# Patient Record
Sex: Female | Born: 1955 | Race: White | Hispanic: No | Marital: Married | Smoking: Never smoker
Health system: Southern US, Community
[De-identification: ages and names within clinical notes are randomized; demographics above are authoritative.]

## PROBLEM LIST (undated history)

## (undated) DIAGNOSIS — R87610 Atypical squamous cells of undetermined significance on cytologic smear of cervix (ASC-US): Secondary | ICD-10-CM

## (undated) DIAGNOSIS — Z87442 Personal history of urinary calculi: Secondary | ICD-10-CM

## (undated) DIAGNOSIS — J309 Allergic rhinitis, unspecified: Secondary | ICD-10-CM

## (undated) DIAGNOSIS — R011 Cardiac murmur, unspecified: Secondary | ICD-10-CM

## (undated) DIAGNOSIS — N200 Calculus of kidney: Secondary | ICD-10-CM

## (undated) DIAGNOSIS — Z8719 Personal history of other diseases of the digestive system: Secondary | ICD-10-CM

## (undated) DIAGNOSIS — E785 Hyperlipidemia, unspecified: Secondary | ICD-10-CM

## (undated) DIAGNOSIS — M5137 Other intervertebral disc degeneration, lumbosacral region: Secondary | ICD-10-CM

## (undated) DIAGNOSIS — Z8679 Personal history of other diseases of the circulatory system: Secondary | ICD-10-CM

## (undated) DIAGNOSIS — M949 Disorder of cartilage, unspecified: Secondary | ICD-10-CM

## (undated) DIAGNOSIS — M899 Disorder of bone, unspecified: Secondary | ICD-10-CM

## (undated) DIAGNOSIS — M19019 Primary osteoarthritis, unspecified shoulder: Secondary | ICD-10-CM

## (undated) DIAGNOSIS — Z87898 Personal history of other specified conditions: Secondary | ICD-10-CM

## (undated) HISTORY — DX: Personal history of urinary calculi: Z87.442

## (undated) HISTORY — DX: Disorder of cartilage, unspecified: M94.9

## (undated) HISTORY — DX: Calculus of kidney: N20.0

## (undated) HISTORY — DX: Personal history of other specified conditions: Z87.898

## (undated) HISTORY — DX: Disorder of bone, unspecified: M89.9

## (undated) HISTORY — DX: Hyperlipidemia, unspecified: E78.5

## (undated) HISTORY — PX: OTHER SURGICAL HISTORY: SHX169

## (undated) HISTORY — DX: Other intervertebral disc degeneration, lumbosacral region: M51.37

## (undated) HISTORY — DX: Primary osteoarthritis, unspecified shoulder: M19.019

## (undated) HISTORY — DX: Personal history of other diseases of the circulatory system: Z86.79

## (undated) HISTORY — DX: Atypical squamous cells of undetermined significance on cytologic smear of cervix (ASC-US): R87.610

## (undated) HISTORY — PX: WISDOM TOOTH EXTRACTION: SHX21

## (undated) HISTORY — DX: Personal history of other diseases of the digestive system: Z87.19

## (undated) HISTORY — DX: Allergic rhinitis, unspecified: J30.9

---

## 1999-08-20 ENCOUNTER — Other Ambulatory Visit: Admission: RE | Admit: 1999-08-20 | Discharge: 1999-08-20 | Payer: Self-pay | Admitting: Obstetrics and Gynecology

## 2001-03-13 ENCOUNTER — Other Ambulatory Visit: Admission: RE | Admit: 2001-03-13 | Discharge: 2001-03-13 | Payer: Self-pay | Admitting: Obstetrics and Gynecology

## 2002-07-13 ENCOUNTER — Ambulatory Visit (HOSPITAL_COMMUNITY): Admission: RE | Admit: 2002-07-13 | Discharge: 2002-07-13 | Payer: Self-pay | Admitting: Cardiovascular Disease

## 2002-07-13 ENCOUNTER — Encounter: Payer: Self-pay | Admitting: Cardiovascular Disease

## 2002-07-28 ENCOUNTER — Encounter: Payer: Self-pay | Admitting: Cardiovascular Disease

## 2002-07-28 ENCOUNTER — Ambulatory Visit (HOSPITAL_COMMUNITY): Admission: RE | Admit: 2002-07-28 | Discharge: 2002-07-28 | Payer: Self-pay | Admitting: Cardiovascular Disease

## 2002-08-18 ENCOUNTER — Encounter (HOSPITAL_BASED_OUTPATIENT_CLINIC_OR_DEPARTMENT_OTHER): Payer: Self-pay | Admitting: General Surgery

## 2002-08-20 ENCOUNTER — Encounter (INDEPENDENT_AMBULATORY_CARE_PROVIDER_SITE_OTHER): Payer: Self-pay | Admitting: *Deleted

## 2002-08-20 ENCOUNTER — Encounter (HOSPITAL_BASED_OUTPATIENT_CLINIC_OR_DEPARTMENT_OTHER): Payer: Self-pay | Admitting: General Surgery

## 2002-08-20 ENCOUNTER — Ambulatory Visit (HOSPITAL_COMMUNITY): Admission: RE | Admit: 2002-08-20 | Discharge: 2002-08-21 | Payer: Self-pay | Admitting: General Surgery

## 2003-03-04 ENCOUNTER — Other Ambulatory Visit: Admission: RE | Admit: 2003-03-04 | Discharge: 2003-03-04 | Payer: Self-pay | Admitting: Obstetrics and Gynecology

## 2004-05-30 ENCOUNTER — Other Ambulatory Visit: Admission: RE | Admit: 2004-05-30 | Discharge: 2004-05-30 | Payer: Self-pay | Admitting: Obstetrics and Gynecology

## 2004-09-20 ENCOUNTER — Ambulatory Visit: Payer: Self-pay | Admitting: Internal Medicine

## 2004-09-25 ENCOUNTER — Ambulatory Visit (HOSPITAL_COMMUNITY): Admission: RE | Admit: 2004-09-25 | Discharge: 2004-09-25 | Payer: Self-pay | Admitting: Internal Medicine

## 2004-09-27 ENCOUNTER — Ambulatory Visit: Payer: Self-pay | Admitting: Gastroenterology

## 2004-10-01 ENCOUNTER — Other Ambulatory Visit: Admission: RE | Admit: 2004-10-01 | Discharge: 2004-10-01 | Payer: Self-pay | Admitting: Gynecology

## 2004-10-08 ENCOUNTER — Ambulatory Visit: Payer: Self-pay | Admitting: Gastroenterology

## 2004-10-18 ENCOUNTER — Encounter (INDEPENDENT_AMBULATORY_CARE_PROVIDER_SITE_OTHER): Payer: Self-pay | Admitting: Specialist

## 2004-10-18 ENCOUNTER — Ambulatory Visit (HOSPITAL_BASED_OUTPATIENT_CLINIC_OR_DEPARTMENT_OTHER): Admission: RE | Admit: 2004-10-18 | Discharge: 2004-10-18 | Payer: Self-pay | Admitting: Gynecology

## 2004-10-18 ENCOUNTER — Ambulatory Visit (HOSPITAL_COMMUNITY): Admission: RE | Admit: 2004-10-18 | Discharge: 2004-10-18 | Payer: Self-pay | Admitting: Gynecology

## 2005-05-02 ENCOUNTER — Ambulatory Visit (HOSPITAL_COMMUNITY): Admission: RE | Admit: 2005-05-02 | Discharge: 2005-05-02 | Payer: Self-pay | Admitting: Internal Medicine

## 2005-05-02 ENCOUNTER — Ambulatory Visit: Payer: Self-pay | Admitting: Internal Medicine

## 2005-05-24 ENCOUNTER — Ambulatory Visit: Payer: Self-pay | Admitting: Internal Medicine

## 2005-06-12 ENCOUNTER — Ambulatory Visit: Payer: Self-pay | Admitting: Internal Medicine

## 2005-10-10 ENCOUNTER — Other Ambulatory Visit: Admission: RE | Admit: 2005-10-10 | Discharge: 2005-10-10 | Payer: Self-pay | Admitting: Gynecology

## 2006-03-26 IMAGING — US US TRANSVAGINAL NON-OB
1 series · 14 of 25 positions shown · non-contrast
Comparison: none

CLINICAL DATA: Heavy periods. 
 ULTRASOUND OF THE PELVIS:
 Transabdominal study reveals the uterus to measure 8.4 x 4.3 x 5.2 cm.  Echo pattern of the uterus is inhomogeneous.  Transvaginal study reveals the endometrial stripe to measure .74 cm.    There is suggestion of a 5 x 4 x 5 mm polyp in the endometrium.  
 Right ovary measures 3.2 x 2.5 x 2.1 cm with a 2.2 x 1.8 x 1.7 cm simple cyst of the right ovary.  The left ovary measures 2.7 x 1.2 x 1.7 cm with a 6 mm follicle of the left ovary.  No free fluid.

[Series 1: unknown · 0.32mm/px · 14 of 74 slices shown]
[im 1/74]
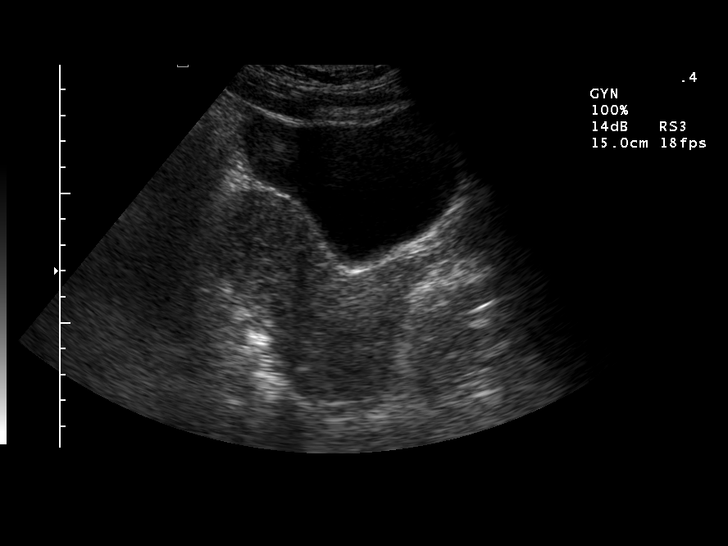
[im 7/74]
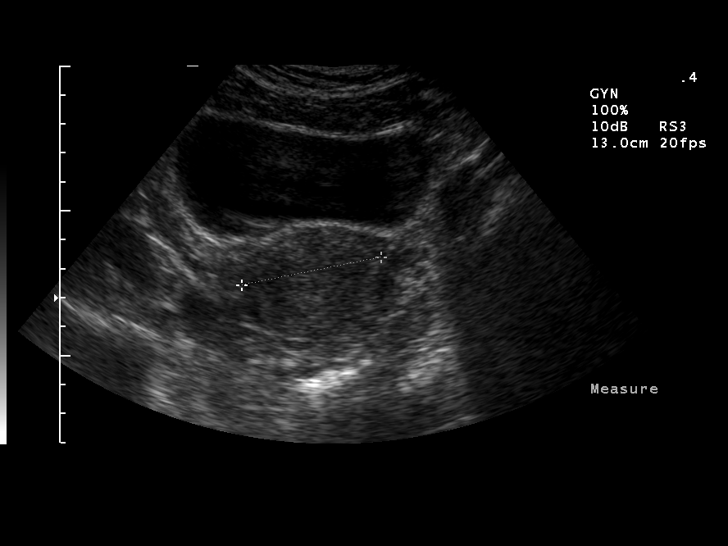
[im 13/74]
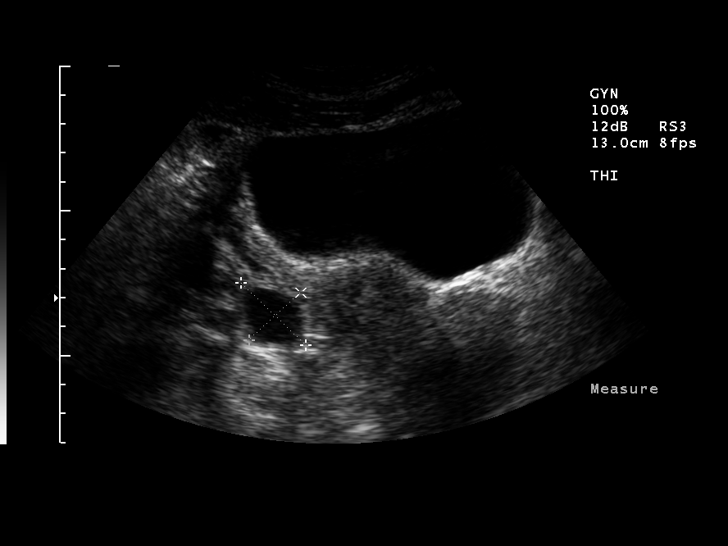
[im 19/74]
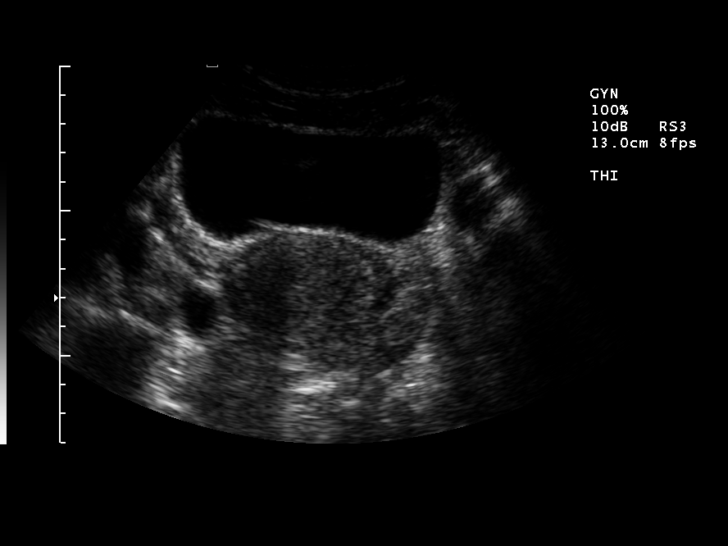
[im 25/74]
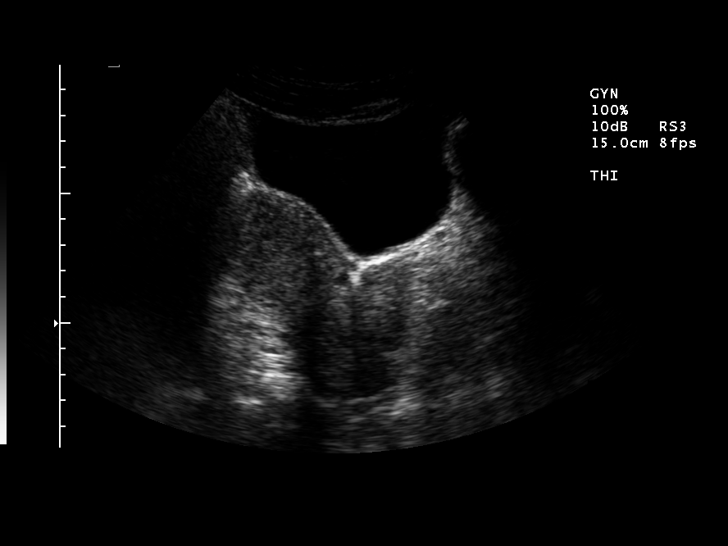
[im 28/74]
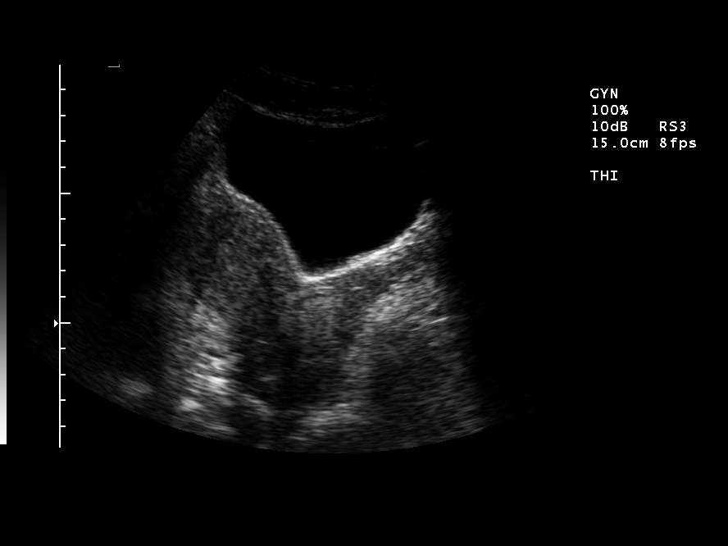
[im 34/74]
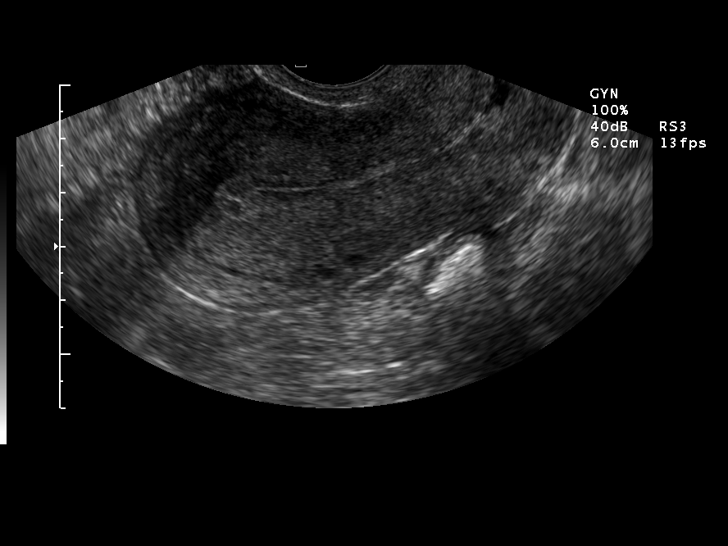
[im 40/74]
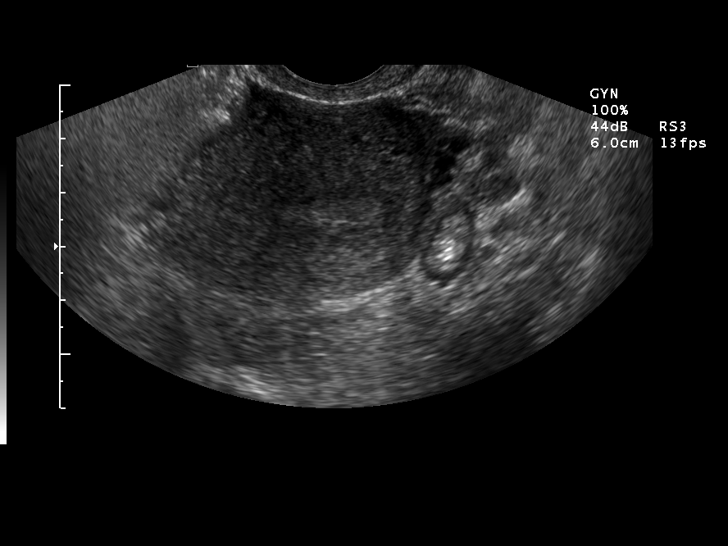
[im 46/74]
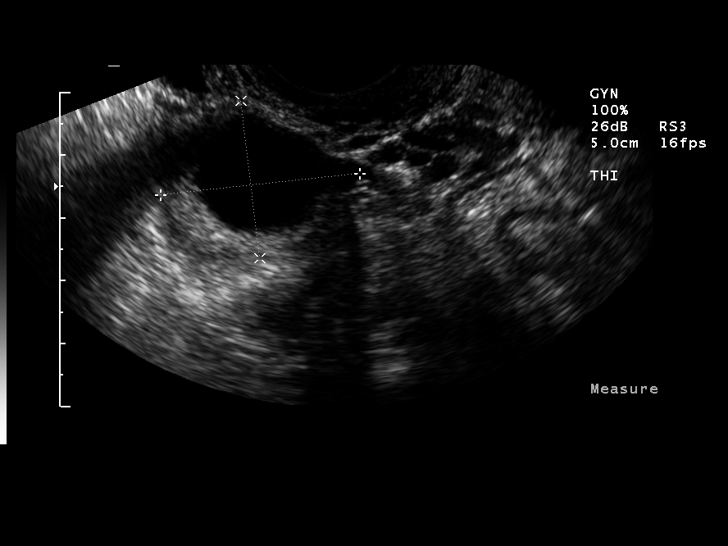
[im 49/74]
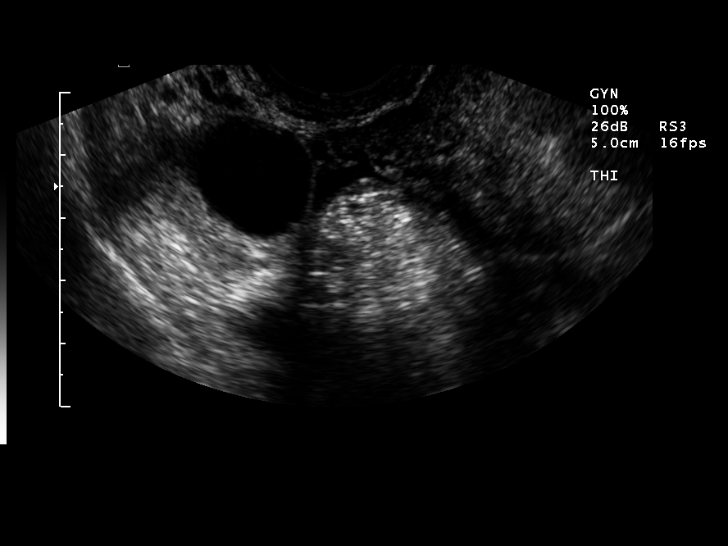
[im 55/74]
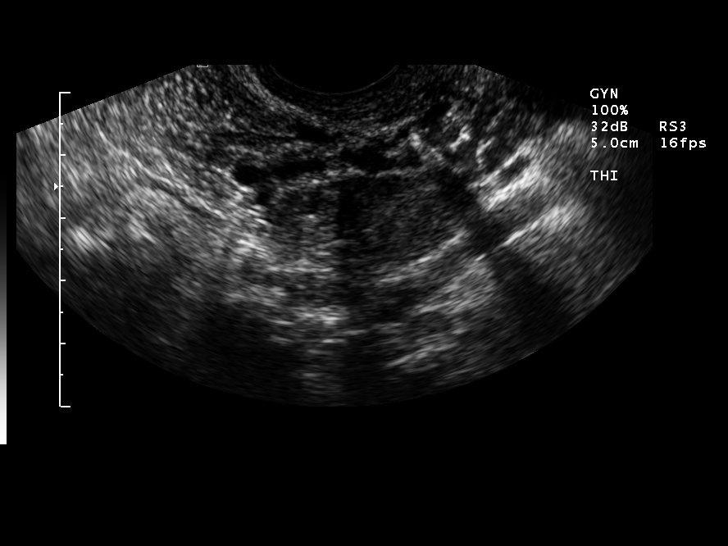
[im 61/74]
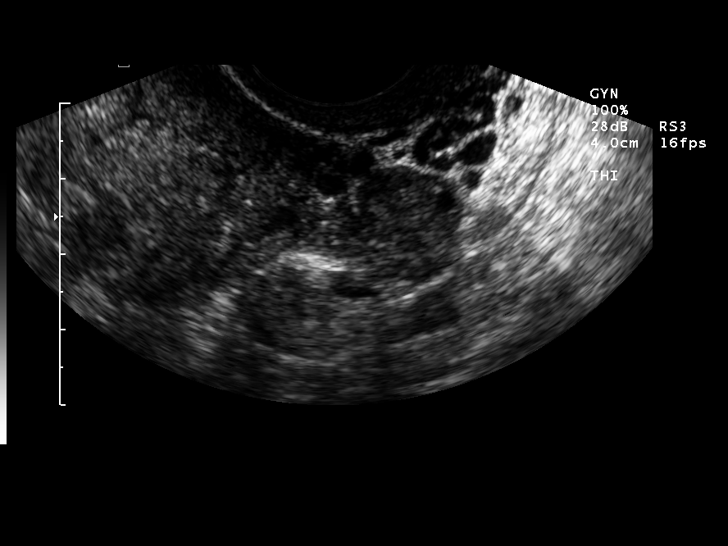
[im 67/74]
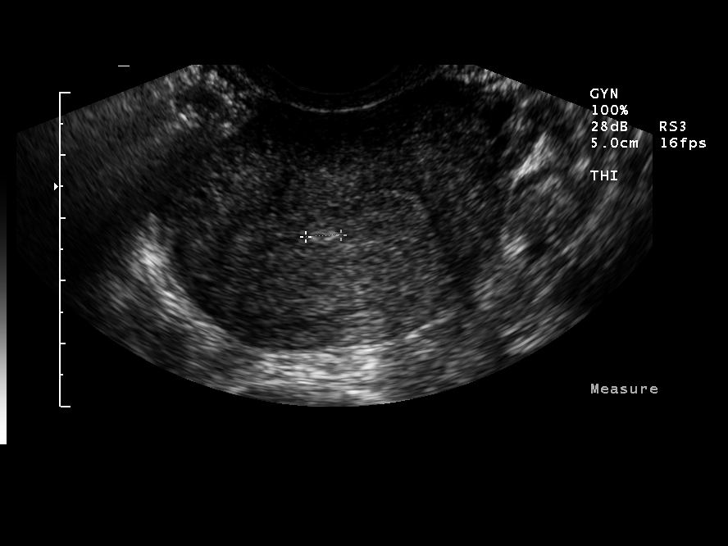
[im 74/74]
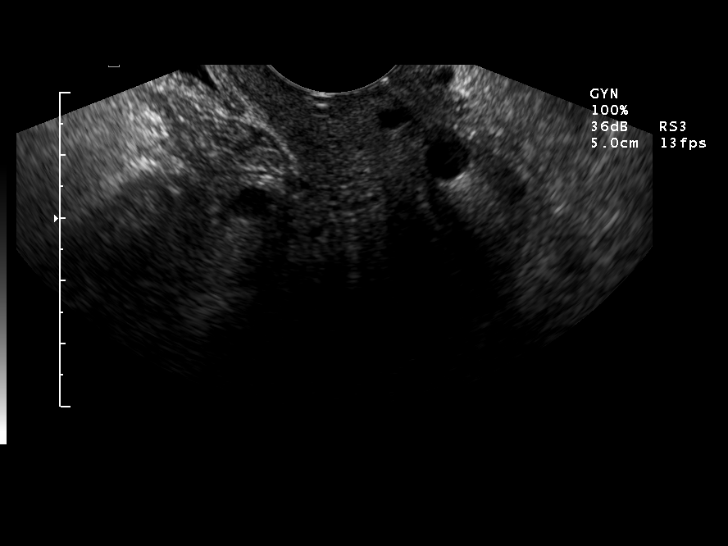

[14 of 25 positions shown; findings below may reference images not displayed]

IMPRESSION: 1.  There is a suspicious 5 mm polyp in the endometrium.  Sonohysterogram would be suggested for further evaluation. 
 2.  2.2 cm cyst of the right ovary.  Followup ultrasound would be suggested for further evaluation of this cyst which appears to represent a simple cyst.

## 2006-12-25 ENCOUNTER — Other Ambulatory Visit: Admission: RE | Admit: 2006-12-25 | Discharge: 2006-12-25 | Payer: Self-pay | Admitting: Gynecology

## 2007-01-06 ENCOUNTER — Ambulatory Visit: Payer: Self-pay | Admitting: Internal Medicine

## 2007-07-01 ENCOUNTER — Encounter: Payer: Self-pay | Admitting: Internal Medicine

## 2007-07-01 ENCOUNTER — Ambulatory Visit: Payer: Self-pay | Admitting: Internal Medicine

## 2007-07-01 DIAGNOSIS — M19019 Primary osteoarthritis, unspecified shoulder: Secondary | ICD-10-CM | POA: Insufficient documentation

## 2007-07-01 DIAGNOSIS — Z87898 Personal history of other specified conditions: Secondary | ICD-10-CM

## 2007-07-01 DIAGNOSIS — M51379 Other intervertebral disc degeneration, lumbosacral region without mention of lumbar back pain or lower extremity pain: Secondary | ICD-10-CM | POA: Insufficient documentation

## 2007-07-01 DIAGNOSIS — Z8679 Personal history of other diseases of the circulatory system: Secondary | ICD-10-CM | POA: Insufficient documentation

## 2007-07-01 DIAGNOSIS — M5137 Other intervertebral disc degeneration, lumbosacral region: Secondary | ICD-10-CM

## 2007-07-01 DIAGNOSIS — R109 Unspecified abdominal pain: Secondary | ICD-10-CM

## 2007-07-01 DIAGNOSIS — J309 Allergic rhinitis, unspecified: Secondary | ICD-10-CM

## 2007-07-01 HISTORY — DX: Other intervertebral disc degeneration, lumbosacral region without mention of lumbar back pain or lower extremity pain: M51.379

## 2007-07-01 HISTORY — DX: Other intervertebral disc degeneration, lumbosacral region: M51.37

## 2007-07-01 HISTORY — DX: Personal history of other specified conditions: Z87.898

## 2007-07-01 HISTORY — DX: Primary osteoarthritis, unspecified shoulder: M19.019

## 2007-07-01 HISTORY — DX: Personal history of other diseases of the circulatory system: Z86.79

## 2007-07-01 HISTORY — DX: Allergic rhinitis, unspecified: J30.9

## 2007-07-02 LAB — CONVERTED CEMR LAB
AST: 24 units/L (ref 0–37)
Alkaline Phosphatase: 49 units/L (ref 39–117)
BUN: 12 mg/dL (ref 6–23)
CO2: 31 meq/L (ref 19–32)
Chloride: 103 meq/L (ref 96–112)
Creatinine, Ser: 0.8 mg/dL (ref 0.4–1.2)
Glucose, Bld: 98 mg/dL (ref 70–99)
HCT: 44 % (ref 36.0–46.0)
Leukocytes, UA: NEGATIVE
Lymphocytes Relative: 23.8 % (ref 12.0–46.0)
MCV: 90.9 fL (ref 78.0–100.0)
Monocytes Absolute: 0.4 10*3/uL (ref 0.2–0.7)
Nitrite: NEGATIVE
RDW: 11.6 % (ref 11.5–14.6)
Sodium: 140 meq/L (ref 135–145)
Total Bilirubin: 1.4 mg/dL — ABNORMAL HIGH (ref 0.3–1.2)
Urine Glucose: NEGATIVE mg/dL
Urobilinogen, UA: 0.2 (ref 0.0–1.0)
pH: 6 (ref 5.0–8.0)

## 2007-07-06 ENCOUNTER — Ambulatory Visit: Payer: Self-pay | Admitting: Cardiovascular Disease

## 2007-07-08 ENCOUNTER — Encounter: Payer: Self-pay | Admitting: Internal Medicine

## 2007-08-21 ENCOUNTER — Ambulatory Visit: Payer: Self-pay | Admitting: Internal Medicine

## 2007-10-28 ENCOUNTER — Ambulatory Visit: Payer: Self-pay | Admitting: Internal Medicine

## 2007-10-28 DIAGNOSIS — J069 Acute upper respiratory infection, unspecified: Secondary | ICD-10-CM | POA: Insufficient documentation

## 2007-10-28 DIAGNOSIS — Z87442 Personal history of urinary calculi: Secondary | ICD-10-CM | POA: Insufficient documentation

## 2007-10-28 HISTORY — DX: Personal history of urinary calculi: Z87.442

## 2007-11-23 ENCOUNTER — Ambulatory Visit: Payer: Self-pay | Admitting: Internal Medicine

## 2007-12-25 ENCOUNTER — Ambulatory Visit: Payer: Self-pay | Admitting: Endocrinology

## 2007-12-25 ENCOUNTER — Encounter: Payer: Self-pay | Admitting: Endocrinology

## 2008-06-14 ENCOUNTER — Ambulatory Visit: Payer: Self-pay | Admitting: Internal Medicine

## 2008-07-06 ENCOUNTER — Encounter: Payer: Self-pay | Admitting: Internal Medicine

## 2008-12-01 ENCOUNTER — Ambulatory Visit: Payer: Self-pay | Admitting: Internal Medicine

## 2008-12-01 DIAGNOSIS — R209 Unspecified disturbances of skin sensation: Secondary | ICD-10-CM

## 2008-12-01 LAB — CONVERTED CEMR LAB: Vit D, 25-Hydroxy: 33 ng/mL (ref 30–89)

## 2008-12-02 ENCOUNTER — Encounter: Payer: Self-pay | Admitting: Internal Medicine

## 2008-12-05 LAB — CONVERTED CEMR LAB
ALT: 14 units/L (ref 0–35)
BUN: 14 mg/dL (ref 6–23)
Basophils Relative: 0.2 % (ref 0.0–3.0)
Bilirubin, Direct: 0.3 mg/dL (ref 0.0–0.3)
Chloride: 103 meq/L (ref 96–112)
Eosinophils Absolute: 0.1 10*3/uL (ref 0.0–0.7)
GFR calc non Af Amer: 80.25 mL/min (ref >60)
HCT: 45.7 % (ref 36.0–46.0)
Hemoglobin: 15.5 g/dL — ABNORMAL HIGH (ref 12.0–15.0)
Lymphocytes Relative: 21.3 % (ref 12.0–46.0)
MCHC: 33.9 g/dL (ref 30.0–36.0)
Neutrophils Relative %: 71.5 % (ref 43.0–77.0)
Platelets: 223 10*3/uL (ref 150.0–400.0)
RBC: 5.03 M/uL (ref 3.87–5.11)
Sed Rate: 6 mm/hr (ref 0–22)
TSH: 1 microintl units/mL (ref 0.35–5.50)
Total Bilirubin: 1.5 mg/dL — ABNORMAL HIGH (ref 0.3–1.2)
Total Protein: 7.2 g/dL (ref 6.0–8.3)
Vitamin B-12: 434 pg/mL (ref 211–911)
WBC: 6.4 10*3/uL (ref 4.5–10.5)

## 2008-12-15 ENCOUNTER — Ambulatory Visit: Payer: Self-pay | Admitting: Internal Medicine

## 2008-12-15 DIAGNOSIS — M25559 Pain in unspecified hip: Secondary | ICD-10-CM | POA: Insufficient documentation

## 2008-12-15 DIAGNOSIS — M542 Cervicalgia: Secondary | ICD-10-CM | POA: Insufficient documentation

## 2008-12-29 ENCOUNTER — Encounter: Payer: Self-pay | Admitting: Internal Medicine

## 2009-01-31 LAB — CONVERTED CEMR LAB: Pap Smear: NORMAL

## 2009-03-03 ENCOUNTER — Ambulatory Visit: Payer: Self-pay | Admitting: Internal Medicine

## 2009-03-03 ENCOUNTER — Encounter: Payer: Self-pay | Admitting: Internal Medicine

## 2009-03-03 DIAGNOSIS — H8309 Labyrinthitis, unspecified ear: Secondary | ICD-10-CM | POA: Insufficient documentation

## 2009-03-13 ENCOUNTER — Ambulatory Visit: Payer: Self-pay | Admitting: Internal Medicine

## 2009-03-13 DIAGNOSIS — M79609 Pain in unspecified limb: Secondary | ICD-10-CM

## 2009-07-25 ENCOUNTER — Ambulatory Visit: Payer: Self-pay | Admitting: Internal Medicine

## 2009-07-25 DIAGNOSIS — I8390 Asymptomatic varicose veins of unspecified lower extremity: Secondary | ICD-10-CM

## 2009-07-25 DIAGNOSIS — I739 Peripheral vascular disease, unspecified: Secondary | ICD-10-CM

## 2009-07-25 DIAGNOSIS — K649 Unspecified hemorrhoids: Secondary | ICD-10-CM | POA: Insufficient documentation

## 2009-07-25 LAB — CONVERTED CEMR LAB
Alkaline Phosphatase: 48 units/L (ref 39–117)
Basophils Absolute: 0.1 10*3/uL (ref 0.0–0.1)
Basophils Relative: 1.1 % (ref 0.0–3.0)
Bilirubin Urine: NEGATIVE
Bilirubin, Direct: 0.2 mg/dL (ref 0.0–0.3)
Direct LDL: 137.3 mg/dL
Eosinophils Absolute: 0.1 10*3/uL (ref 0.0–0.7)
Eosinophils Relative: 1.6 % (ref 0.0–5.0)
Ketones, ur: NEGATIVE mg/dL
Lymphocytes Relative: 23.7 % (ref 12.0–46.0)
Monocytes Absolute: 0.3 10*3/uL (ref 0.1–1.0)
Monocytes Relative: 5.9 % (ref 3.0–12.0)
Nitrite: NEGATIVE
Platelets: 207 10*3/uL (ref 150.0–400.0)
Potassium: 4.4 meq/L (ref 3.5–5.1)
RDW: 11.9 % (ref 11.5–14.6)
Rhuematoid fact SerPl-aCnc: 22 intl units/mL — ABNORMAL HIGH (ref 0.0–20.0)
Sed Rate: 6 mm/hr (ref 0–22)
Sodium: 141 meq/L (ref 135–145)
Specific Gravity, Urine: 1.01 (ref 1.000–1.030)
TSH: 1.1 microintl units/mL (ref 0.35–5.50)
Total Bilirubin: 1.6 mg/dL — ABNORMAL HIGH (ref 0.3–1.2)
Total Protein, Urine: NEGATIVE mg/dL
Triglycerides: 107 mg/dL (ref 0.0–149.0)
Urine Glucose: NEGATIVE mg/dL
Urobilinogen, UA: 0.2 (ref 0.0–1.0)
VLDL: 21.4 mg/dL (ref 0.0–40.0)
WBC: 5.5 10*3/uL (ref 4.5–10.5)
pH: 6 (ref 5.0–8.0)

## 2009-08-02 ENCOUNTER — Ambulatory Visit: Payer: Self-pay

## 2009-08-02 ENCOUNTER — Encounter: Payer: Self-pay | Admitting: Internal Medicine

## 2009-08-02 DIAGNOSIS — R0989 Other specified symptoms and signs involving the circulatory and respiratory systems: Secondary | ICD-10-CM

## 2009-08-07 ENCOUNTER — Telehealth: Payer: Self-pay | Admitting: Internal Medicine

## 2009-08-07 DIAGNOSIS — E049 Nontoxic goiter, unspecified: Secondary | ICD-10-CM | POA: Insufficient documentation

## 2009-08-08 ENCOUNTER — Encounter: Admission: RE | Admit: 2009-08-08 | Discharge: 2009-08-08 | Payer: Self-pay | Admitting: Internal Medicine

## 2009-08-10 ENCOUNTER — Telehealth (INDEPENDENT_AMBULATORY_CARE_PROVIDER_SITE_OTHER): Payer: Self-pay | Admitting: *Deleted

## 2009-08-10 ENCOUNTER — Encounter (INDEPENDENT_AMBULATORY_CARE_PROVIDER_SITE_OTHER): Payer: Self-pay | Admitting: *Deleted

## 2009-08-10 ENCOUNTER — Encounter: Payer: Self-pay | Admitting: Internal Medicine

## 2009-09-05 ENCOUNTER — Encounter: Payer: Self-pay | Admitting: Internal Medicine

## 2009-09-11 ENCOUNTER — Ambulatory Visit: Payer: Self-pay | Admitting: Gastroenterology

## 2009-09-11 DIAGNOSIS — R490 Dysphonia: Secondary | ICD-10-CM | POA: Insufficient documentation

## 2009-09-11 DIAGNOSIS — R143 Flatulence: Secondary | ICD-10-CM

## 2009-09-11 DIAGNOSIS — R142 Eructation: Secondary | ICD-10-CM

## 2009-09-11 DIAGNOSIS — R141 Gas pain: Secondary | ICD-10-CM

## 2009-09-19 ENCOUNTER — Encounter: Payer: Self-pay | Admitting: Internal Medicine

## 2009-10-24 ENCOUNTER — Ambulatory Visit: Payer: Self-pay | Admitting: Gastroenterology

## 2009-10-24 DIAGNOSIS — IMO0001 Reserved for inherently not codable concepts without codable children: Secondary | ICD-10-CM | POA: Insufficient documentation

## 2009-11-27 ENCOUNTER — Ambulatory Visit: Payer: Self-pay | Admitting: Internal Medicine

## 2009-12-11 ENCOUNTER — Ambulatory Visit: Payer: Self-pay | Admitting: Gastroenterology

## 2010-01-10 ENCOUNTER — Encounter: Payer: Self-pay | Admitting: Internal Medicine

## 2010-01-31 ENCOUNTER — Telehealth: Payer: Self-pay | Admitting: Internal Medicine

## 2010-01-31 ENCOUNTER — Encounter: Payer: Self-pay | Admitting: Internal Medicine

## 2010-02-02 ENCOUNTER — Encounter: Admission: RE | Admit: 2010-02-02 | Discharge: 2010-02-02 | Payer: Self-pay | Admitting: Internal Medicine

## 2010-03-23 ENCOUNTER — Ambulatory Visit: Payer: Self-pay | Admitting: Internal Medicine

## 2010-07-20 ENCOUNTER — Ambulatory Visit: Payer: Self-pay | Admitting: Internal Medicine

## 2010-07-20 LAB — CONVERTED CEMR LAB
ALT: 20 units/L (ref 0–35)
Bilirubin, Direct: 0.2 mg/dL (ref 0.0–0.3)
Direct LDL: 129 mg/dL
Eosinophils Absolute: 0.1 10*3/uL (ref 0.0–0.7)
GFR calc non Af Amer: 82.11 mL/min (ref >60)
Glucose, Bld: 96 mg/dL (ref 70–99)
HCT: 43.8 % (ref 36.0–46.0)
Hemoglobin, Urine: NEGATIVE
Hemoglobin: 14.9 g/dL (ref 12.0–15.0)
Ketones, ur: NEGATIVE mg/dL
Leukocytes, UA: NEGATIVE
Lymphs Abs: 1.5 10*3/uL (ref 0.7–4.0)
MCV: 91.9 fL (ref 78.0–100.0)
Neutro Abs: 3.3 10*3/uL (ref 1.4–7.7)
Platelets: 258 10*3/uL (ref 150.0–400.0)
RBC: 4.76 M/uL (ref 3.87–5.11)
Sodium: 140 meq/L (ref 135–145)
Specific Gravity, Urine: 1.01 (ref 1.000–1.030)
Total Bilirubin: 1.7 mg/dL — ABNORMAL HIGH (ref 0.3–1.2)
Total Protein: 6.9 g/dL (ref 6.0–8.3)
pH: 6 (ref 5.0–8.0)

## 2010-07-25 ENCOUNTER — Ambulatory Visit: Payer: Self-pay | Admitting: Internal Medicine

## 2010-07-25 DIAGNOSIS — M949 Disorder of cartilage, unspecified: Secondary | ICD-10-CM

## 2010-07-25 DIAGNOSIS — M899 Disorder of bone, unspecified: Secondary | ICD-10-CM | POA: Insufficient documentation

## 2010-07-26 DIAGNOSIS — E785 Hyperlipidemia, unspecified: Secondary | ICD-10-CM | POA: Insufficient documentation

## 2010-07-26 HISTORY — DX: Hyperlipidemia, unspecified: E78.5

## 2010-10-04 NOTE — Letter (Signed)
Summary: Gretta Cool MD  Gretta Cool MD   Imported By: Lester Augusta 01/17/2010 09:37:27  _____________________________________________________________________  External Attachment:    Type:   Image     Comment:   External Document

## 2010-10-04 NOTE — Miscellaneous (Signed)
Summary: Orders Update  Clinical Lists Changes  Orders: Added new Referral order of Misc. Referral (Misc. Ref) - Signed 

## 2010-10-04 NOTE — Assessment & Plan Note (Signed)
Summary: 6 WEEK F/U..AM.   History of Present Illness Visit Type: Follow-up Visit Primary GI MD: Elie Goody MD [facility] Primary Provider: Oliver Barre, MD Requesting Provider: Oliver Barre, MD Chief Complaint: follow up for bloating and gas, took Align for a while, but is not taking anything right now, pt is avoiding dairy products History of Present Illness:   This is a 55 year old female who has had improvement in gas and bloating with a lactose free diet and use of Align. Her symptoms have not completely completely abated.   GI Review of Systems    Reports bloating.      Denies abdominal pain, acid reflux, belching, chest pain, dysphagia with liquids, dysphagia with solids, heartburn, loss of appetite, nausea, vomiting, vomiting blood, weight loss, and  weight gain.        Denies anal fissure, black tarry stools, change in bowel habit, constipation, diarrhea, diverticulosis, fecal incontinence, heme positive stool, hemorrhoids, irritable bowel syndrome, jaundice, light color stool, liver problems, rectal bleeding, and  rectal pain.   Current Medications (verified): 1)  Vivelle-Dot 0.05 Mg/24hr  Pttw (Estradiol) .... Change Patch Twice Weekly 2)  Prometrium 200 Mg  Caps (Progesterone Micronized) .... One By Mouth Once Daily For The First 12 Days of The Month 3)  Vitamin D 1000 Unit Tabs (Cholecalciferol) .... Once Daily 4)  Calcium 1200 1200-1000 Mg-Unit Chew (Calcium Carbonate-Vit D-Min) .... Once Daily 5)  Nabumetone 750 Mg Tabs (Nabumetone) .Marland Kitchen.. 1 By Mouth Two Times A Day As Needed 6)  Lyrica 75 Mg Caps (Pregabalin) .... Take 1 Tablet By Mouth Once A Day  Allergies (verified): No Known Drug Allergies  Past History:  Past Medical History: Reviewed history from 09/11/2009 and no changes required. AC joint djd bilat Mitral valve prolapse genital herpes Low back pain/lumbar disc dz Allergic rhinitis Nephrolithiasis, hx of Peripheral vascular disease Hemorrhoids, internal and  external Thyroid cysts  Past Surgical History: Reviewed history from 09/11/2009 and no changes required. Cholecystectomy '03 Uterine polyp with polypectomy '06 Lasik eye surgery '07 Wisdom Teeth extraction  Family History: Reviewed history from 09/11/2009 and no changes required. aunt with ovary cancer-aunt Family History Hypertension Family History of CAD Female 1st degree relative <43 M. uncle with celiac disease No FH of Colon Cancer:  Social History: Reviewed history from 12/01/2008 and no changes required. Never Smoked Alcohol use-no Married 1 daughter Occupation:assist teacher  Review of Systems       The pertinent positives and negatives are noted as above and in the HPI. All other ROS were reviewed and were negative.   Vital Signs:  Patient profile:   55 year old female Height:      66 inches Weight:      149 pounds BMI:     24.14 Pulse rate:   68 / minute Pulse rhythm:   regular BP sitting:   134 / 76  (left arm) Cuff size:   regular  Vitals Entered By: Francee Piccolo CMA Duncan Dull) (October 24, 2009 2:15 PM)  Physical Exam  General:  Well developed, well nourished, no acute distress. Head:  Normocephalic and atraumatic. Eyes:  PERRLA, no icterus. Mouth:  No deformity or lesions, dentition normal. Lungs:  Clear throughout to auscultation. Heart:  Regular rate and rhythm; no murmurs, rubs,  or bruits. Abdomen:  Soft, nontender and nondistended. No masses, hepatosplenomegaly or hernias noted. Normal bowel sounds. Psych:  Alert and cooperative. Normal mood and affect.   Impression & Recommendations:  Problem # 1:  FLATULENCE-GAS-BLOATING (ICD-787.3)  Plan a trial of Xifaxan, for 10 days and Gas-X q.i.d. p.r.n. Begin a low gas diet. Consider a trial of glycopyrrolate twice daily as needed, if her symptoms persist.  Patient Instructions: 1)  Excessive Gas Diet handout given.  2)  Start Xifaxan one tablet by mouth three times a day x 10 days.  3)   Take Gas-X four times a day as needed. 4)  Please schedule a follow-up appointment in 6  weeks.  5)  The medication list was reviewed and reconciled.  All changed / newly prescribed medications were explained.  A complete medication list was provided to the patient / caregiver.  Prescriptions: XIFAXAN 550 MG TABS (RIFAXIMIN) one tablet by mouth three times a day  #12 x 0   Entered by:   Christie Nottingham CMA (AAMA)   Authorized by:   Meryl Dare MD [facility]   Signed by:   Meryl Dare MD FACG on 10/24/2009   Method used:   Electronically to        [facility] (retail)       [address]       [place], [place]  [address]       Ph: [phone] or [phone]       Fax: [phone]   RxID:   [id number]

## 2010-10-04 NOTE — Procedures (Signed)
Summary: Colonoscopy   Colonoscopy  Procedure date:  10/08/2004  Findings:      Results: Hemorrhoids.     Location:  [facility].    Procedures Next Due Date:    Colonoscopy: 10/2012 Patient Name: Joanne Prince, Rybarczyk MRN:  Procedure Procedures: Colonoscopy CPT: [phone].    with injection sclerosis of hemorrhoids, CPT: 46500  Personnel: Endoscopist: Venita Lick. Russella Dar, MD, Clementeen Graham.  Referred By: Corwin Levins, MD.  Exam Location: Exam performed in Outpatient Clinic. Outpatient  Patient Consent: Procedure, Alternatives, Risks and Benefits discussed, consent obtained, from patient. Consent was obtained by the RN.  Indications Symptoms: Hematochezia.  History  Current Medications: Patient is not currently taking Coumadin.  Pre-Exam Physical: Performed Oct 08, 2004. Cardio-pulmonary exam WNL. Rectal exam abnormal. HEENT exam , Abdominal exam, Neurological exam, Mental status exam WNL. Abnormal PE findings include: ext. hemorrhoids.  Exam Exam: Extent of exam reached: Cecum, extent intended: Cecum.  The cecum was identified by appendiceal orifice and IC valve. Colon retroflexion performed. ASA Classification: II. Tolerance: excellent.  Monitoring: Pulse and BP monitoring, Oximetry used. Supplemental O2 given.  Colon Prep Used Miralax for colon prep. Prep results: good.  Sedation Meds: Patient assessed and found to be appropriate for moderate (conscious) sedation. Fentanyl 75 mcg. given IV. Versed 8 mg. given IV.  Findings NORMAL EXAM: Cecum to Sigmoid Colon.  HEMORRHOIDS: Internal. Size: Medium. Not bleeding. Not thrombosed. ICD9: Hemorrhoids, Internal: 455.0.  - Injection: Rectum. 2.5 ccs. Outcome: successful. Comments: 23.4% saline .   Assessment  Diagnoses: 455.6: Hemorrhoids, Internal and  External.   Events  Unplanned Interventions: No intervention was required.  Unplanned Events: There were no complications. Plans Medication Plan: Continue  current medications. Hemorrhoidal Medications: prn,   Patient Education: Patient given standard instructions for: Hemorrhoids.  Disposition: After procedure patient sent to recovery. After recovery patient sent home.  Scheduling/Referral: Colonoscopy, to [facility] T. Russella Dar, MD, [facility], around Oct 08, 2012.  Office Visit, to [employer]. Russella Dar, MD, [facility], prn    This report was created from the original endoscopy report, which was reviewed and signed by the above listed endoscopist.    cc: Corwin Levins, MD

## 2010-10-04 NOTE — Assessment & Plan Note (Signed)
Summary: ?strep/lb   Vital Signs:  Patient profile:   55 year old female Height:      65 inches Weight:      146.50 pounds BMI:     24.47 O2 Sat:      96 % on Room air Temp:     97.6 degrees F oral Pulse rate:   76 / minute BP sitting:   124 / 72  (left arm) Cuff size:   regular  Vitals Entered By: Zella Ball Ewing CMA Duncan Dull) (March 23, 2010 2:34 PM)  O2 Flow:  Room air CC: White spots on right side of throat/RE   Primary Care Provider:  Oliver Barre, MD  CC:  White spots on right side of throat/RE.  History of Present Illness: here with acute osnet fever, marked ST for 2 days adn with exudate to the right post throat;  with mild headache, general weakness and malaise, but Pt denies CP, sob, doe, wheezing, orthopnea, pnd, worsening LE edema, palps, dizziness or syncope    Problems Prior to Update: 1)  Pharyngitis-acute  (ICD-462) 2)  Fibromyalgia  (ICD-729.1) 3)  Hoarseness  (ICD-784.42) 4)  Flatulence-gas-bloating  (ICD-787.3) 5)  Thyroid Mass  (ICD-240.9) 6)  Other Symptoms Involving Cardiovascular System  (ICD-785.9) 7)  Peripheral Vascular Disease  (ICD-443.9) 8)  Preventive Health Care  (ICD-V70.0) 9)  Varicose Veins, Lower Extremities  (ICD-454.9) 10)  Hemorrhoids  (ICD-455.6) 11)  Leg Pain, Bilateral  (ICD-729.5) 12)  Foot Pain, Left  (ICD-729.5) 13)  Unspecified Labyrinthitis  (ICD-386.30) 14)  Hip Pain, Right  (ICD-719.45) 15)  Cervicalgia  (ICD-723.1) 16)  Paresthesia  (ICD-782.0) 17)  Uri  (ICD-465.9) 18)  Nephrolithiasis, Hx of  (ICD-V13.01) 19)  Abdominal Pain, Suprapubic  (ICD-789.09) 20)  Allergic Rhinitis  (ICD-477.9) 21)  Disc Disease, Lumbar  (ICD-722.52) 22)  Low Back Pain  (ICD-724.2) 23)  Genital Herpes, Hx of  (ICD-V13.8) 24)  Mitral Valve Prolapse, Hx of  (ICD-V12.50) 25)  Osteoarthrosis, Local Nos, 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 Change Patch Twice Weekly 2)  Prometrium 200 Mg  Caps (Progesterone Micronized) .... One By Mouth Once Daily For The First 12 Days of The Month 3)  Vitamin D 1000 Unit Tabs (Cholecalciferol) .... Once Daily 4)  Lyrica 75 Mg Caps (Pregabalin) .... Take 1 Tablet By Mouth Once A Day 5)  Mobic 15 Mg Tabs (Meloxicam) .Marland Kitchen.. 1 By Mouth Once Daily 6)  Hydrocodone-Homatropine 5-1.5 Mg/32ml Syrp (Hydrocodone-Homatropine) .Marland Kitchen.. 1 Tsp By Mouth Q 6 Hrs As Needed Cough 7)  Gas-X 80 Mg Chew (Simethicone) .... As Needed  Current Medications (verified): 1)  Vivelle-Dot 0.05 Mg/24hr  Pttw (Estradiol) .... Change Patch Twice Weekly 2)  Prometrium 200 Mg  Caps (Progesterone Micronized) .... One By Mouth Once Daily For The First 12 Days of The Month 3)  Vitamin D 1000 Unit Tabs (Cholecalciferol) .... Once Daily 4)  Lyrica 75 Mg Caps (Pregabalin) .... Take 1 Tablet By Mouth Once A Day 5)  Mobic 15 Mg Tabs (Meloxicam) .Marland Kitchen.. 1 By Mouth Once Daily 6)  Hydrocodone-Homatropine 5-1.5 Mg/34ml Syrp (Hydrocodone-Homatropine) .Marland Kitchen.. 1 Tsp By Mouth Q 6 Hrs As Needed Cough 7)  Gas-X 80 Mg Chew (Simethicone) .... As Needed 8)  Azithromycin 250 Mg Tabs (Azithromycin) .... 2po Qd For 1 Day, Then 1po Qd For 4days, Then Stop  Allergies (verified): No Known Drug  Allergies  Past History:  Past Medical History: Last updated: 12/11/2009 [facility] joint djd bilat Mitral valve prolapse Genital herpes Low back pain/lumbar disc dz Allergic rhinitis Nephrolithiasis, hx of Peripheral vascular disease Hemorrhoids, internal and external Thyroid cysts Anal fissure  Past Surgical History: Last updated: 09/11/2009 Cholecystectomy '03 Uterine polyp with polypectomy '06 Lasik eye surgery '07 Wisdom Teeth extraction  Social History: Last updated: 12/01/2008 Never Smoked Alcohol use-no 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  , mild ill  Head:  normocephalic and atraumatic.   Eyes:  vision grossly intact, pupils equal, and pupils round.   Ears:  bilat tm's red, sinus nontender Nose:  nasal dischargemucosal pallor and mucosal edema.   Mouth:  pharyngeal erythema, fair dentition, and pharyngeal exudate.   Neck:  supple and cervical lymphadenopathy.   Lungs:  normal respiratory effort and normal breath sounds.   Heart:  normal rate and regular rhythm.   Extremities:  no edema, no erythema    Impression & Recommendations:  Problem # 1:  PHARYNGITIS-ACUTE (ICD-462)  Her updated medication list for this problem includes:    Mobic 15 Mg Tabs (Meloxicam) .Marland Kitchen... 1 by mouth once daily    Azithromycin 250 Mg Tabs (Azithromycin) .Marland Kitchen... 2po qd for 1 day, then 1po qd for 4days, then stop treat as above, f/u any worsening signs or symptoms   Complete Medication List: 1)  Vivelle-dot 0.05 Mg/24hr Pttw (Estradiol) .... Change patch twice weekly 2)  Prometrium 200 Mg Caps (Progesterone micronized) .... One by mouth once daily for the first 12 days of the month 3)  Vitamin D 1000 Unit Tabs (Cholecalciferol) .... Once daily 4)  Lyrica 75 Mg Caps (Pregabalin) .... Take 1 tablet by mouth once a day 5)  Mobic 15 Mg Tabs (Meloxicam) .Marland Kitchen.. 1 by mouth once daily 6)  Hydrocodone-homatropine 5-1.5 Mg/48ml Syrp (Hydrocodone-homatropine) .Marland Kitchen.. 1 tsp by mouth q 6 hrs as needed cough 7)  Gas-x 80 Mg Chew (Simethicone) .... As needed 8)  Azithromycin 250 Mg Tabs (Azithromycin) .... 2po qd for 1 day, then 1po qd for 4days, then stop  Patient Instructions: 1)  Please take all new medications as prescribed 2)  Continue all previous medications as before this visit  3)  Please schedule a follow-up appointment in 4 months with CPX labs Prescriptions: AZITHROMYCIN 250 MG TABS (AZITHROMYCIN) 2po qd for 1 day,  then 1po qd for 4days, then stop  #6 x 1   Entered and Authorized by:   Corwin Levins MD   Signed by:   Corwin Levins MD on 03/23/2010   Method used:   Print then Give to Patient   RxID:   [id number]

## 2010-10-04 NOTE — Progress Notes (Signed)
----   Converted from flag ---- ---- 01/31/2010 4:19 PM, Corwin Levins MD wrote: Zella Ball to call pt - due for f/u thyroid u/s -  I will order  ---- 08/08/2009 6:37 PM, Corwin Levins MD wrote: needs f/u thyroid u/s at 6 mo ------------------------------  called pt left msg to call back 01/31/2010 called pt left msg to call back 02/01/2010  Patient called back informed of above information. 02/01/2010

## 2010-10-04 NOTE — Assessment & Plan Note (Signed)
Summary: cough up yellow mucus--stc   Vital Signs:  Patient profile:   55 year old female Height:      65 inches Weight:      147.75 pounds BMI:     24.68 O2 Sat:      98 % on Room air Temp:     98.3 degrees F oral Pulse rate:   75 / minute BP sitting:   120 / 72  (left arm) Cuff size:   regular  Vitals Entered ByZella Ball Ewing (November 27, 2009 9:45 AM)  O2 Flow:  Room air CC: cough/RE   Primary Care Provider:  Oliver Barre, MD  CC:  cough/RE.  History of Present Illness: here with acute onset mild to mod 3 days worsening ST, fever, heachae, malaise, and now prod cough with greenish sputum, but Pt denies CP, sob, doe, wheezing, orthopnea, pnd, worsening LE edema, palps, dizziness or syncope   Problems Prior to Update: 1)  Fibromyalgia  (ICD-729.1) 2)  Hoarseness  (ICD-784.42) 3)  Flatulence-gas-bloating  (ICD-787.3) 4)  Thyroid Mass  (ICD-240.9) 5)  Other Symptoms Involving Cardiovascular System  (ICD-785.9) 6)  Peripheral Vascular Disease  (ICD-443.9) 7)  Preventive Health Care  (ICD-V70.0) 8)  Varicose Veins, Lower Extremities  (ICD-454.9) 9)  Hemorrhoids  (ICD-455.6) 10)  Leg Pain, Bilateral  (ICD-729.5) 11)  Foot Pain, Left  (ICD-729.5) 12)  Unspecified Labyrinthitis  (ICD-386.30) 13)  Hip Pain, Right  (ICD-719.45) 14)  Cervicalgia  (ICD-723.1) 15)  Paresthesia  (ICD-782.0) 16)  Uri  (ICD-465.9) 17)  Nephrolithiasis, Hx of  (ICD-V13.01) 18)  Abdominal Pain, Suprapubic  (ICD-789.09) 19)  Allergic Rhinitis  (ICD-477.9) 20)  Disc Disease, Lumbar  (ICD-722.52) 21)  Low Back Pain  (ICD-724.2) 22)  Genital Herpes, Hx of  (ICD-V13.8) 23)  Mitral Valve Prolapse, Hx of  (ICD-V12.50) 24)  Osteoarthrosis, Local Nos, 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 Change Patch Twice Weekly 2)  Prometrium 200 Mg  Caps (Progesterone Micronized)  .... One By Mouth Once Daily For The First 12 Days of The Month 3)  Vitamin D 1000 Unit Tabs (Cholecalciferol) .... Once Daily 4)  Calcium 1200 1200-1000 Mg-Unit Chew (Calcium Carbonate-Vit D-Min) .... Once Daily 5)  Nabumetone 750 Mg Tabs (Nabumetone) .Marland Kitchen.. 1 By Mouth Two Times A Day As Needed 6)  Lyrica 75 Mg Caps (Pregabalin) .... Take 1 Tablet By Mouth Once A Day 7)  Xifaxan 550 Mg Tabs (Rifaximin) .... One Tablet By Mouth Three Times A Day  Current Medications (verified): 1)  Vivelle-Dot 0.05 Mg/24hr  Pttw (Estradiol) .... Change Patch Twice Weekly 2)  Prometrium 200 Mg  Caps (Progesterone Micronized) .... One By Mouth Once Daily For The First 12 Days of The Month 3)  Vitamin D 1000 Unit Tabs (Cholecalciferol) .... Once Daily 4)  Calcium 1200 1200-1000 Mg-Unit Chew (Calcium Carbonate-Vit D-Min) .... Once Daily 5)  Nabumetone 750 Mg Tabs (Nabumetone) .Marland Kitchen.. 1 By Mouth Two Times A Day As Needed 6)  Lyrica 75 Mg Caps (Pregabalin) .... Take 1 Tablet By Mouth Once A Day 7)  Xifaxan 550 Mg Tabs (Rifaximin) .... One Tablet By Mouth Three Times A Day 8)  Mobic 15 Mg Tabs (Meloxicam) .Marland Kitchen.. 1 By Mouth Once Daily 9)  Azithromycin 250 Mg Tabs (Azithromycin) .... 2po Qd For 1 Day, Then 1po Qd For 4days, Then Stop 10)  Hydrocodone-Homatropine 5-1.5 Mg/100ml  Syrp (Hydrocodone-Homatropine) .Marland Kitchen.. 1 Tsp By Mouth Q 6 Hrs As Needed Cough  Allergies (verified): No Known Drug Allergies  Past History:  Past Medical History: Last updated: 09/11/2009 [facility] joint djd bilat Mitral valve prolapse genital herpes Low back pain/lumbar disc dz Allergic rhinitis Nephrolithiasis, hx of Peripheral vascular disease Hemorrhoids, internal and external Thyroid cysts  Past Surgical History: Last updated: 09/11/2009 Cholecystectomy '03 Uterine polyp with polypectomy '06 Lasik eye surgery '07 Wisdom Teeth extraction  Social History: Last updated: 12/01/2008 Never Smoked Alcohol use-no Married 1  daughter Occupation:assist teacher  Risk Factors: Alcohol Use: 0 (03/03/2009)  Risk Factors: Smoking Status: never (03/03/2009)  Review of Systems       all otherwise negative per pt -    Physical Exam  General:  alert and well-developed.  , mild ill  Head:  normocephalic and atraumatic.   Eyes:  vision grossly intact, pupils equal, and pupils round.   Ears:  bilat tm's red with mild bulging on the right, canals clear Nose:  nasal dischargemucosal pallor and mucosal edema.   Mouth:  pharyngeal erythema and fair dentition.   Neck:  supple and cervical lymphadenopathy.   Lungs:  normal respiratory effort and normal breath sounds.   Heart:  normal rate and regular rhythm.   Extremities:  no edema, no erythema    Impression & Recommendations:  Problem # 1:  BRONCHITIS-ACUTE (ICD-466.0)  Her updated medication list for this problem includes:    Xifaxan 550 Mg Tabs (Rifaximin) ..... One tablet by mouth three times a day    Azithromycin 250 Mg Tabs (Azithromycin) .Marland Kitchen... 2po qd for 1 day, then 1po qd for 4days, then stop    Hydrocodone-homatropine 5-1.5 Mg/79ml Syrp (Hydrocodone-homatropine) .Marland Kitchen... 1 tsp by mouth q 6 hrs as needed cough treat as above, f/u any worsening signs or symptoms   Complete Medication List: 1)  Vivelle-dot 0.05 Mg/24hr Pttw (Estradiol) .... Change patch twice weekly 2)  Prometrium 200 Mg Caps (Progesterone micronized) .... One by mouth once daily for the first 12 days of the month 3)  Vitamin D 1000 Unit Tabs (Cholecalciferol) .... Once daily 4)  Calcium 1200 1200-1000 Mg-unit Chew (Calcium carbonate-vit d-min) .... Once daily 5)  Nabumetone 750 Mg Tabs (Nabumetone) .Marland Kitchen.. 1 by mouth two times a day as needed 6)  Lyrica 75 Mg Caps (Pregabalin) .... Take 1 tablet by mouth once a day 7)  Xifaxan 550 Mg Tabs (Rifaximin) .... One tablet by mouth three times a day 8)  Mobic 15 Mg Tabs (Meloxicam) .Marland Kitchen.. 1 by mouth once daily 9)  Azithromycin 250 Mg Tabs  (Azithromycin) .... 2po qd for 1 day, then 1po qd for 4days, then stop 10)  Hydrocodone-homatropine 5-1.5 Mg/53ml Syrp (Hydrocodone-homatropine) .Marland Kitchen.. 1 tsp by mouth q 6 hrs as needed cough  Patient Instructions: 1)  Please take all new medications as prescribed 2)  Continue all previous medications as before this visit  3)  Please schedule a follow-up appointment November 2011 with CPX labs Prescriptions: HYDROCODONE-HOMATROPINE 5-1.5 MG/5ML SYRP (HYDROCODONE-HOMATROPINE) 1 tsp by mouth q 6 hrs as needed cough  #6 oz x 1   Entered and Authorized by:   Corwin Levins MD   Signed by:   Corwin Levins MD on 11/27/2009   Method used:   Print then Give to Patient   RxID:   [id number] AZITHROMYCIN 250 MG TABS (AZITHROMYCIN) 2po qd for 1 day, then 1po qd for 4days, then stop  #6 x 1   Entered and  Authorized by:   Corwin Levins MD   Signed by:   Corwin Levins MD on 11/27/2009   Method used:   Print then Give to Patient   RxID:   [id number]

## 2010-10-04 NOTE — Assessment & Plan Note (Signed)
Summary: F/U APPT...LSW.   History of Present Illness Visit Type: Follow-up Visit Primary GI MD: Elie Goody MD [facility] Primary Provider: Oliver Barre, MD Requesting Provider: n/a Chief Complaint: F/u for gas and bloating. Pt states that she feels great and denies any GI complaints  History of Present Illness:   This is a return office visit for gas and bloating. On a low gas, lactose free diet, and Gas-X p.r.n. her symptoms are under good control. She feels the course of Xifaxan helped as well.   GI Review of Systems      Denies abdominal pain, acid reflux, belching, bloating, chest pain, dysphagia with liquids, dysphagia with solids, heartburn, loss of appetite, nausea, vomiting, vomiting blood, weight loss, and  weight gain.        Denies anal fissure, black tarry stools, change in bowel habit, constipation, diarrhea, diverticulosis, fecal incontinence, heme positive stool, hemorrhoids, irritable bowel syndrome, jaundice, light color stool, liver problems, rectal bleeding, and  rectal pain.   Current Medications (verified): 1)  Vivelle-Dot 0.05 Mg/24hr  Pttw (Estradiol) .... Change Patch Twice Weekly 2)  Prometrium 200 Mg  Caps (Progesterone Micronized) .... One By Mouth Once Daily For The First 12 Days of The Month 3)  Vitamin D 1000 Unit Tabs (Cholecalciferol) .... Once Daily 4)  Lyrica 75 Mg Caps (Pregabalin) .... Take 1 Tablet By Mouth Once A Day 5)  Mobic 15 Mg Tabs (Meloxicam) .Marland Kitchen.. 1 By Mouth Once Daily 6)  Hydrocodone-Homatropine 5-1.5 Mg/53ml Syrp (Hydrocodone-Homatropine) .Marland Kitchen.. 1 Tsp By Mouth Q 6 Hrs As Needed Cough 7)  Gas-X 80 Mg Chew (Simethicone) .... As Needed  Allergies (verified): No Known Drug Allergies  Past History:  Past Medical History: AC joint djd bilat Mitral valve prolapse Genital herpes Low back pain/lumbar disc dz Allergic rhinitis Nephrolithiasis, hx of Peripheral vascular disease Hemorrhoids, internal and external Thyroid cysts Anal  fissure  Past Surgical History: Reviewed history from 09/11/2009 and no changes required. Cholecystectomy '03 Uterine polyp with polypectomy '06 Lasik eye surgery '07 Wisdom Teeth extraction  Family History: Reviewed history from 09/11/2009 and no changes required. aunt with ovary cancer-aunt Family History Hypertension Family History of CAD Female 1st degree relative <61 M. uncle with celiac disease No FH of Colon Cancer:  Social History: Reviewed history from 12/01/2008 and no changes required. Never Smoked Alcohol use-no Married 1 daughter Occupation:assist teacher  Review of Systems       The pertinent positives and negatives are noted as above and in the HPI. All other ROS were reviewed and were negative.   Vital Signs:  Patient profile:   55 year old female Height:      65 inches Weight:      147 pounds BMI:     24.55 BSA:     1.74 Pulse rate:   82 / minute Pulse rhythm:   regular BP sitting:   116 / 68  (left arm) Cuff size:   regular  Vitals Entered By: Ok Anis CMA (December 11, 2009 2:07 PM)  Physical Exam  General:  Well developed, well nourished, no acute distress. Head:  Normocephalic and atraumatic. Eyes:  PERRLA, no icterus. Mouth:  No deformity or lesions, dentition normal. Lungs:  Clear throughout to auscultation. Heart:  Regular rate and rhythm; no murmurs, rubs,  or bruits. Abdomen:  Soft, nontender and nondistended. No masses, hepatosplenomegaly or hernias noted. Normal bowel sounds. Psych:  Alert and cooperative. Normal mood and affect.  Impression & Recommendations:  Problem # 1:  FLATULENCE-GAS-BLOATING (ICD-787.3) Continue a low gas, lactose-free diet, and Gas-X as needed. Followup p.r.n.  Patient Instructions: 1)  Please schedule a follow-up appointment as needed.  2)  Please continue current medications.  3)  The medication list was reviewed and reconciled.  All changed / newly prescribed medications were explained.  A complete  medication list was provided to the patient / caregiver.

## 2010-10-04 NOTE — Letter (Signed)
Summary: [facility]   Imported By: Sherian Rein 09/18/2009 09:36:00  _____________________________________________________________________  External Attachment:    Type:   Image     Comment:   External Document

## 2010-10-04 NOTE — Op Note (Signed)
Summary: Laparoscopic Cholecystectomy    NAME:  MALYNN, LUCY                            ACCOUNT NO.:  [id number]   MEDICAL RECORD NO.:  [id number]                   PATIENT TYPE:  OIB   LOCATION:  5714                                 FACILITY:  [facility]   PHYSICIAN:  Leonie Man, M.D.                DATE OF BIRTH:  1956/04/06   DATE OF PROCEDURE:  08/20/2002  DATE OF DISCHARGE:  08/21/2002                                 OPERATIVE REPORT   PREOPERATIVE DIAGNOSES:  Chronic calculous cholecystitis.   POSTOPERATIVE DIAGNOSES:  Chronic calculous cholecystitis.   PROCEDURE:  Laparoscopic cholecystectomy with intraoperative cholangiogram.   SURGEON:  Leonie Man, M.D.   ASSISTANT:  Joanne Gavel, M.D.   ANESTHESIA:  General.   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  She  underwent cardiac evaluation for this, which was negative.  A gallbladder  ultrasound showed multiple gallstones without evidence of extrahepatic  ductal dilatation or stigmata of acute cholecystitis.  Her liver function  studies are normal except for a mildly elevated total bilirubin of 1.3.  A  HIDA scan is normal.  The patient comes to the operating room now after the  risks and potential benefits of surgery have been fully discussed, all  questions answered, and consent obtained.   PROCEDURE IN DETAIL:  Following the induction of satisfactory general  anesthesia with the patient positioned supinely, the abdomen was routinely  prepped and draped, being free of the sterile operative field.  An open  laparoscopy was created at the umbilicus with the insertion of a Hasson  cannula and insufflation of the peritoneal cavity to 14 mmHg using carbon  dioxide.  The camera was inserted, and video lens inspection of the abdomen  was carried out.  The liver surface was smooth. The gallbladder was  chronically scarred  with multiple gallstones.  The anterior gastric wall  appeared to be normal.  The small and large intestines were viewed and  appeared to be normal.  The pelvic organs were not visualized.   Under direct vision, epigastric and lateral ports were placed.  The  gallbladder was grasped and retracted cephalad, and dissection was carried  down to the region of the ampulla with isolation of the cystic artery and  cystic duct, the cystic artery being traced up to its entry in the  gallbladder wall and cystic duct being traced to the gallbladder/cystic duct  junction.  The cystic artery was doubly clipped and transected.  The cystic  duct was split proximally and opened.  I inserted a Reddick catheter into  the abdomen through a 14-gauge angiocath and inserted the catheter then into  the cystic duct.  I injected one-half strength Hypaque contrast into the  cystic duct under fluoroscopic guidance. The resulting cholangiogram then  showed free flow of contrast into the  duodenum, normal caliber extrahepatic  ducts, with no filling defects.  The cholangiocatheter was removed. The  cystic duct was doubly clipped and transected.  The gallbladder was then  dissected free from the liver bed using electrocautery and maintaining  hemostasis along the course of the dissection.  I ended the dissection.  The  right upper quadrant was then thoroughly checked.  Hemostasis was noted to  be excellent.  The camera was then moved to the epigastric port after the  right upper quadrant was thoroughly irrigated with normal saline.  The  gallbladder was retrieved through the umbilical port without difficulty.  Pneumoperitoneum was allowed to deflate after all of the saline was  aspirated, and all of the gas was evacuated.  Sponge, instrument, and sharp  counts were verified.  The wound was then closed in layers as follows:  umbilical wound in two layers with 0 Dexon and 4-0 Dexon, epigastric and  lateral flank wounds  were all closed with 4-0 Dexon, and all of the wounds  were reinforced with Steri-Strips, and sterile dressings were applied.  The  anesthetic was reversed, and the patient was removed from the operating room  to the recovery room in stable condition.  She tolerated the procedure well.                                               Leonie Man, M.D.    PB/MEDQ  D:  08/20/2002  T:  08/21/2002  Job:  [id number]

## 2010-10-04 NOTE — Letter (Signed)
Summary: [facility]   Imported By: Sherian Rein 09/29/2009 12:20:20  _____________________________________________________________________  External Attachment:    Type:   Image     Comment:   External Document

## 2010-10-04 NOTE — Assessment & Plan Note (Signed)
Summary: PER FU NOT A PHYSICAL -PT HAVE PHYSICAL AT HER GYN MD-[facility]   Vital Signs:  Patient profile:   55 year old female Height:      65 inches Weight:      148.38 pounds BMI:     24.78 O2 Sat:      97 % on Room air Temp:     98.6 degrees F oral Pulse rate:   82 / minute BP sitting:   120 / 70  (left arm) Cuff size:   regular  Vitals Entered By: Zella Ball Ewing CMA Duncan Dull) (July 25, 2010 8:54 AM)  O2 Flow:  Room air  Preventive Care Screening  Pap Smear:    Date:  07/03/2010    Results:  normal   Last Tetanus Booster:    Date:  07/03/2010    Results:  Tdap   Bone Density:    Date:  03/02/2010    Results:  abnormal std dev  Mammogram:    Date:  03/02/2010    Results:  normal   CC: Followup on Labs/RE   Primary Care Provider:  Oliver Barre, MD  CC:  Followup on Labs/RE.  History of Present Illness:  overall doing ok, Pt denies CP, worsening sob, doe, wheezing, orthopnea, pnd, worsening LE edema, palps, dizziness or syncope  Pt denies new neuro symptoms such as headache, facial or extremity weakness  Pt denies polydipsia, polyuria.  Overall good compliance with meds, trying to follow low chol diet, wt stable, little excercise however Denies worsening depressive symptoms, suicidal ideation, or panic.  No fever, wt loss, night sweats, loss of appetite or other constitutional symptoms  Overall good compliance with meds, and good tolerability.  Has some ongoing fatigue, but Denies worsening depressive symptoms, suicidal ideation, or panic.  , and no OSA symptoms Pain OK on current symptoms and functioning well socially and at work., including the lower back, without increased severity, or worsening LE pain/weak/numb, bowel or bladder change, fall or gait change - overall mild, not worse recetnly with position change, and no injury or trauma  Preventive Screening-Counseling & Management      Drug Use:  no.    Problems Prior to Update: 1)  Fatigue  (ICD-780.79) 2)  Osteopenia   (ICD-733.90) 3)  Fibromyalgia  (ICD-729.1) 4)  Hoarseness  (ICD-784.42) 5)  Flatulence-gas-bloating  (ICD-787.3) 6)  Thyroid Mass  (ICD-240.9) 7)  Other Symptoms Involving Cardiovascular System  (ICD-785.9) 8)  Peripheral Vascular Disease  (ICD-443.9) 9)  Preventive Health Care  (ICD-V70.0) 10)  Varicose Veins, Lower Extremities  (ICD-454.9) 11)  Hemorrhoids  (ICD-455.6) 12)  Leg Pain, Bilateral  (ICD-729.5) 13)  Foot Pain, Left  (ICD-729.5) 14)  Unspecified Labyrinthitis  (ICD-386.30) 15)  Hip Pain, Right  (ICD-719.45) 16)  Cervicalgia  (ICD-723.1) 17)  Paresthesia  (ICD-782.0) 18)  Uri  (ICD-465.9) 19)  Nephrolithiasis, Hx of  (ICD-V13.01) 20)  Abdominal Pain, Suprapubic  (ICD-789.09) 21)  Allergic Rhinitis  (ICD-477.9) 22)  Disc Disease, Lumbar  (ICD-722.52) 23)  Low Back Pain  (ICD-724.2) 24)  Genital Herpes, Hx of  (ICD-V13.8) 25)  Mitral Valve Prolapse, Hx of  (ICD-V12.50) 26)  Osteoarthrosis, Local Nos, 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 Change Patch Twice Weekly 2)  Prometrium 200 Mg  Caps (Progesterone Micronized) .... One By Mouth Once Daily For The First 12 Days of The Month 3)  Vitamin D 1000 Unit Tabs (Cholecalciferol) .... Once Daily 4)  Lyrica 75 Mg Caps (Pregabalin) .... Take 1 Tablet By Mouth Once A Day 5)  Mobic 15 Mg Tabs (Meloxicam) .Marland Kitchen.. 1 By Mouth Once Daily 6)  Hydrocodone-Homatropine 5-1.5 Mg/30ml Syrp (Hydrocodone-Homatropine) .Marland Kitchen.. 1 Tsp By Mouth Q 6 Hrs As Needed Cough 7)  Gas-X 80 Mg Chew (Simethicone) .... As Needed 8)  Azithromycin 250 Mg Tabs (Azithromycin) .... 2po Qd For 1 Day, Then 1po Qd For 4days, Then Stop  Current Medications (verified): 1)  Vivelle-Dot 0.05 Mg/24hr  Pttw (Estradiol) .... Change Patch Twice Weekly 2)  Prometrium 200 Mg  Caps (Progesterone Micronized) .... One By Mouth Once Daily For The  First 12 Days of The Month 3)  Vitamin D 1000 Unit Tabs (Cholecalciferol) .... Once Daily 4)  Lyrica 75 Mg Caps (Pregabalin) .... Take 1 Tablet By Mouth Once A Day 5)  Mobic 15 Mg Tabs (Meloxicam) .Marland Kitchen.. 1 By Mouth Once Daily 6)  Gas-X 80 Mg Chew (Simethicone) .... As Needed 7)  Vitamin D3 2000 Unit Caps (Cholecalciferol) .Marland Kitchen.. 1 By Mouth Once Daily 8)  Zinc 50 Mg Tabs (Zinc) .Marland Kitchen.. 1 By Mouth Once Daily 9)  Vitamin B-6 100 Mg Tabs (Pyridoxine Hcl) .Marland Kitchen.. 1 By Mouth Once Daily 10)  Biotin 1000 Mcg Tabs (Biotin) .Marland Kitchen.. 1 By Mouth Once Daily 11)  Niacin 500 Mg Tabs (Niacin) .Marland Kitchen.. 1 By Mouth Once Daily  Allergies (verified): No Known Drug Allergies  Past History:  Past Surgical History: Last updated: 09/11/2009 Cholecystectomy '03 Uterine polyp with polypectomy '06 Lasik eye surgery '07 Wisdom Teeth extraction  Family History: Last updated: 09/11/2009 aunt with ovary cancer-aunt Family History Hypertension Family History of CAD Female 1st degree relative <60 M. 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   Head:  normocephalic and atraumatic.   Eyes:  vision grossly intact, pupils equal, and pupils round.   Ears:  R ear normal and L ear normal.     Nose:  nasal dischargemucosal pallor and mucosal edema.   Mouth:  good dentition and pharynx pink and moist.   Neck:  supple and no masses.   Lungs:  normal respiratory effort and normal breath sounds.   Heart:  normal rate and regular rhythm.   Abdomen:  soft, non-tender, and normal bowel sounds.   Msk:  no joint tenderness and no joint swelling.  , spine nontender Extremities:  no edema, no erythema  Neurologic:  cranial nerves II-XII intact, strength normal in all extremities, sensation intact to light touch, and gait normal.   Skin:  color normal and no rashes.   Psych:  not anxious appearing and not depressed appearing.     Impression & Recommendations:  Problem # 1:  FATIGUE (ICD-780.79) exam benign, recent labs reviewed; ; follow with expectant management   Problem #  2:  FIBROMYALGIA (ICD-729.1)  Her updated medication list for this problem includes:    Mobic 15 Mg Tabs (Meloxicam) .Marland Kitchen... 1 by mouth once daily stable overall by hx and exam, ok to continue meds/tx as is   Problem # 3:  LOW BACK PAIN (ICD-724.2)  Her updated medication list for this problem includes:    Mobic 15 Mg Tabs (Meloxicam) .Marland Kitchen... 1 by mouth once daily also stable  - Continue all previous medications as before this visit   Problem # 4:  HYPERLIPIDEMIA (ICD-272.4)  Her updated medication list for this problem includes:    Niacin 500 Mg Tabs (Niacin) .Marland Kitchen... 1 by mouth once daily stable overall by hx and exam, ok to continue meds/tx as is   Labs Reviewed: SGOT: 25 (07/20/2010)   SGPT: 20 (07/20/2010)   HDL:58.40 (07/20/2010), 59.40 (07/25/2009)  Chol:208 (07/20/2010), 215 (07/25/2009)  Trig:90.0 (07/20/2010), 107.0 (07/25/2009)  Complete Medication List: 1)  Vivelle-dot 0.05 Mg/24hr Pttw (Estradiol) .... Change patch twice weekly 2)  Prometrium 200 Mg Caps (Progesterone micronized) .... One by mouth once daily for the first 12 days of the month 3)  Vitamin D 1000 Unit Tabs (Cholecalciferol) ....  Once daily 4)  Lyrica 75 Mg Caps (Pregabalin) .... Take 1 tablet by mouth once a day 5)  Mobic 15 Mg Tabs (Meloxicam) .Marland Kitchen.. 1 by mouth once daily 6)  Gas-x 80 Mg Chew (Simethicone) .... As needed 7)  Vitamin D3 2000 Unit Caps (Cholecalciferol) .Marland Kitchen.. 1 by mouth once daily 8)  Zinc 50 Mg Tabs (Zinc) .Marland Kitchen.. 1 by mouth once daily 9)  Vitamin B-6 100 Mg Tabs (Pyridoxine hcl) .Marland Kitchen.. 1 by mouth once daily 10)  Biotin 1000 Mcg Tabs (Biotin) .Marland Kitchen.. 1 by mouth once daily 11)  Niacin 500 Mg Tabs (Niacin) .Marland Kitchen.. 1 by mouth once daily  Patient Instructions: 1)  Continue all previous medications as before this visit  2)  Please follow lower cholesterol diet 3)  Please schedule a follow-up appointment in 1 year, or sooner if needed   Orders Added: 1)  Est. Patient Level IV [29562]

## 2010-10-04 NOTE — Assessment & Plan Note (Signed)
Summary: ABD PAIN/YF   History of Present Illness Visit Type: Initial Consult Primary GI MD: Elie Goody MD [facility] Primary Provider: Oliver Barre, MD Requesting Provider: Oliver Barre, MD Chief Complaint: Patient here to discuss problems with bloating and gas. She denies any abdominal pain, reflux or other GI symptoms. History of Present Illness:   This is a 55 year old female whom I have seen in the past for hematochezia which was related to internal and external hemorrhoids. She underwent colonoscopy with injection sclerosis of internal hemorrhoids in 2006. She recently had surgical consultation and found to have external hemorrhoids.  She relates a one year history of daily gas, bloating, and excessive flatus. She notes no dietary changes. She states the bloating occurs after meals and the flatus generally later in the day and not problematic in the mornings. She has no other gastrointestinal complaints. A celiac disease panel in November 2010 was negative.   GI Review of Systems    Reports bloating.      Denies abdominal pain, acid reflux, belching, chest pain, dysphagia with liquids, dysphagia with solids, heartburn, loss of appetite, nausea, vomiting, vomiting blood, weight loss, and  weight gain.      Reports hemorrhoids.     Denies anal fissure, black tarry stools, change in bowel habit, constipation, diarrhea, diverticulosis, fecal incontinence, heme positive stool, irritable bowel syndrome, jaundice, light color stool, liver problems, rectal bleeding, and  rectal pain.   Current Medications (verified): 1)  Vivelle-Dot 0.05 Mg/24hr  Pttw (Estradiol) .... 2 Patch Q Wk 2)  Prometrium 200 Mg  Caps (Progesterone Micronized) .... One By Mouth Once Daily For The First 12 Days of The Month 3)  Vitamin D 1000 Unit Tabs (Cholecalciferol) .... Once Daily 4)  Calcium 1200 1200-1000 Mg-Unit Chew (Calcium Carbonate-Vit D-Min) .... Once Daily 5)  Nabumetone 750 Mg Tabs (Nabumetone) .Marland Kitchen.. 1 By  Mouth Two Times A Day As Needed 6)  Lyrica 75 Mg Caps (Pregabalin) .... Take 1 Tablet By Mouth Once A Day  Allergies (verified): No Known Drug Allergies  Past History:  Past Medical History: AC joint djd bilat Mitral valve prolapse genital herpes Low back pain/lumbar disc dz Allergic rhinitis Nephrolithiasis, hx of Peripheral vascular disease Hemorrhoids, internal and external Thyroid cysts  Past Surgical History: Cholecystectomy '03 Uterine polyp with polypectomy '06 Lasik eye surgery '07 Wisdom Teeth extraction  Family History: aunt with ovary cancer-aunt Family History Hypertension Family History of CAD Female 1st degree relative <34 M. uncle with celiac disease No FH of Colon Cancer:  Social History: Reviewed history from 12/01/2008 and no changes required. 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         The pertinent positives and negatives are noted as above and in the HPI. All other ROS were reviewed and were negative.  Vital Signs:  Patient profile:   55 year old female Height:      66 inches Weight:      149 pounds BMI:     24.14 BSA:     1.77 Pulse rate:   80 / minute Pulse rhythm:   regular BP sitting:   120 / 74  (left arm)  Vitals Entered By: Hortense Ramal CMA Duncan Dull) (September 11, 2009 9:50 AM)  Physical Exam  General:  Well developed, well nourished, no acute distress. Head:  Normocephalic and atraumatic. Eyes:  PERRLA, no  icterus. Ears:  Normal auditory acuity. Mouth:  No deformity or lesions, dentition normal. Neck:  Supple; no masses or thyromegaly. Lungs:  Clear throughout to auscultation. Heart:  Regular rate and rhythm; no murmurs, rubs,  or bruits. Abdomen:  Soft, nontender and nondistended. No masses, hepatosplenomegaly or hernias noted. Normal bowel sounds. Msk:  Symmetrical with no gross  deformities. Normal posture. Pulses:  Normal pulses noted. Extremities:  No clubbing, cyanosis, edema or deformities noted. Neurologic:  Alert and  oriented x4;  grossly normal neurologically. Cervical Nodes:  No significant cervical adenopathy. Inguinal Nodes:  No significant inguinal adenopathy. Psych:  Alert and cooperative. Normal mood and affect.   Impression & Recommendations:  Problem # 1:  FLATULENCE-GAS-BLOATING (ICD-787.3) Rule out lactose intolerance, and bacterial overgrowth. Trial of a lactose-free, low gas diet and begin a daily probiotic. May use Gas-X p.r.n.  Problem # 2:  HOARSENESS (UJW-119.14) ENT referral.  Patient Instructions: 1)  We have scheduled you to see Dr. Jearld Fenton at [facility] on 09-20-09 at 9:00am arriving 8:45am. If you need to reschedule please call them at [phone]. 2)  Start Align one tablet by mouth once daily x 2 months.  3)  Excessive Gas Diet handout given.  4)  Please schedule a follow-up appointment in 6 weeks.  5)  Copy sent to : Manus Rudd, MD 6)                         Oliver Barre, MD 7)  The medication list was reviewed and reconciled.  All changed / newly prescribed medications were explained.  A complete medication list was provided to the patient / caregiver.

## 2011-01-18 NOTE — Op Note (Signed)
NAME:  Joanne, Prince                            ACCOUNT NO.:  [id number]   MEDICAL RECORD NO.:  [id number]                   PATIENT TYPE:  OIB   LOCATION:  5714                                 FACILITY:  [facility]   PHYSICIAN:  Leonie Man, M.D.                DATE OF BIRTH:  10-26-1955   DATE OF PROCEDURE:  08/20/2002  DATE OF DISCHARGE:  08/21/2002                                 OPERATIVE REPORT   PREOPERATIVE DIAGNOSES:  Chronic calculous cholecystitis.   POSTOPERATIVE DIAGNOSES:  Chronic calculous cholecystitis.   PROCEDURE:  Laparoscopic cholecystectomy with intraoperative cholangiogram.   SURGEON:  Leonie Man, M.D.   ASSISTANT:  Joanne Gavel, M.D.   ANESTHESIA:  General.   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  She  underwent cardiac evaluation for this, which was negative.  A gallbladder  ultrasound showed multiple gallstones without evidence of extrahepatic  ductal dilatation or stigmata of acute cholecystitis.  Her liver function  studies are normal except for a mildly elevated total bilirubin of 1.3.  A  HIDA scan is normal.  The patient comes to the operating room now after the  risks and potential benefits of surgery have been fully discussed, all  questions answered, and consent obtained.   PROCEDURE IN DETAIL:  Following the induction of satisfactory general  anesthesia with the patient positioned supinely, the abdomen was routinely  prepped and draped, being free of the sterile operative field.  An open  laparoscopy was created at the umbilicus with the insertion of a Hasson  cannula and insufflation of the peritoneal cavity to 14 mmHg using carbon  dioxide.  The camera was inserted, and video lens inspection of the abdomen  was carried out.  The liver surface was smooth. The gallbladder was  chronically scarred with multiple gallstones.  The anterior  gastric wall  appeared to be normal.  The small and large intestines were viewed and  appeared to be normal.  The pelvic organs were not visualized.   Under direct vision, epigastric and lateral ports were placed.  The  gallbladder was grasped and retracted cephalad, and dissection was carried  down to the region of the ampulla with isolation of the cystic artery and  cystic duct, the cystic artery being traced up to its entry in the  gallbladder wall and cystic duct being traced to the gallbladder/cystic duct  junction.  The cystic artery was doubly clipped and transected.  The cystic  duct was split proximally and opened.  I inserted a Reddick catheter into  the abdomen through a 14-gauge angiocath and inserted the catheter then into  the cystic duct.  I injected one-half strength Hypaque contrast into the  cystic duct under fluoroscopic guidance. The resulting cholangiogram then  showed free flow of contrast into the duodenum, normal caliber extrahepatic  ducts, with no filling defects.  The cholangiocatheter was removed. The  cystic duct was doubly clipped and transected.  The gallbladder was then  dissected free from the liver bed using electrocautery and maintaining  hemostasis along the course of the dissection.  I ended the dissection.  The  right upper quadrant was then thoroughly checked.  Hemostasis was noted to  be excellent.  The camera was then moved to the epigastric port after the  right upper quadrant was thoroughly irrigated with normal saline.  The  gallbladder was retrieved through the umbilical port without difficulty.  Pneumoperitoneum was allowed to deflate after all of the saline was  aspirated, and all of the gas was evacuated.  Sponge, instrument, and sharp  counts were verified.  The wound was then closed in layers as follows:  umbilical wound in two layers with 0 Dexon and 4-0 Dexon, epigastric and  lateral flank wounds were all closed with 4-0 Dexon, and all of  the wounds  were reinforced with Steri-Strips, and sterile dressings were applied.  The  anesthetic was reversed, and the patient was removed from the operating room  to the recovery room in stable condition.  She tolerated the procedure well.                                               Leonie Man, M.D.    PB/MEDQ  D:  08/20/2002  T:  08/21/2002  Job:  [id number]

## 2011-01-18 NOTE — Op Note (Signed)
NAME:  Joanne Prince, TAORMINA                  ACCOUNT NO.:  [id number]   MEDICAL RECORD NO.:  [id number]          PATIENT TYPE:  AMB   LOCATION:  [facility]                         FACILITY:  [facility]   PHYSICIAN:  Gretta Cool, M.D. DATE OF BIRTH:  06/10/1956   DATE OF PROCEDURE:  10/18/2004  DATE OF DISCHARGE:                                 OPERATIVE REPORT   SURGEON:  Gretta Cool, M.D.   ANESTHESIA:  IV sedation and paracervical  block.   DESCRIPTION OF PROCEDURE:  Under excellent anesthesia as above with the  patient prepped and draped in Allen stirrups, a weighted speculum was placed  in the vagina, and the cervix grasped with a single-toothed tenaculum.  It  was then progressively dilated with a series of Pratt dilators to  accommodate the 7-mm resectoscope.  The photographs were taken of the tubal  ostia and of the endometrial polyp on the posterior wall of the uterus.  The  endometrial polyp was initially resected from the posterior uterine wall,  and then the entire cavity treated by resection extending approximately 5 mm  out into the myometrium.  Once the entire inner myometrium had been resected  including the uterine lining, the cornual areas were treated by touch  technique with a Vapotrode, and then the entire endometrial cavity was  treated with Vapotrode at 200 watts pure cut.  Once the entire cavity was  treated, the pressure was reduced and bleeding controlled at reduced  pressure.  At the end of the procedure, the estimated fluid deficit was  approximately 250 cc.  There were no complications. The patient returned to  the recovery room in excellent condition.      CWL/MEDQ  D:  10/18/2004  T:  10/18/2004  Job:  [id number]   cc:   Corwin Levins, M.D. [facility] T. Russella Dar, M.D. [facility]

## 2011-02-11 ENCOUNTER — Telehealth: Payer: Self-pay

## 2011-02-11 DIAGNOSIS — E041 Nontoxic single thyroid nodule: Secondary | ICD-10-CM

## 2011-02-11 NOTE — Telephone Encounter (Signed)
Addended by: Corwin Levins on: 02/11/2011 12:36 PM   Modules accepted: Orders

## 2011-02-11 NOTE — Telephone Encounter (Signed)
Called the patient and she does want to schedule followup U/S.

## 2011-02-11 NOTE — Telephone Encounter (Signed)
Thyroid u/s order done

## 2011-02-11 NOTE — Telephone Encounter (Signed)
Message copied by Pincus Sanes on Mon Feb 11, 2011  9:10 AM ------      Message from: Anselm Jungling      Created: Mon Feb 11, 2011  8:02 AM      Regarding: FW: yearly thyroid u/s                   ----- Message -----         From: Oliver Barre, MD         Sent: 02/10/2011   8:12 PM           To: Margaret Pyle, CMA      Subject: yearly thyroid u/s                                       Pt is due for thyroid u/s f/u;  Please ask pt if ok to order this

## 2011-02-14 ENCOUNTER — Ambulatory Visit
Admission: RE | Admit: 2011-02-14 | Discharge: 2011-02-14 | Disposition: A | Discharge status: Home / Self Care | Source: Ambulatory Visit | Attending: Internal Medicine | Admitting: Internal Medicine

## 2011-02-14 DIAGNOSIS — E041 Nontoxic single thyroid nodule: Secondary | ICD-10-CM

## 2011-02-25 ENCOUNTER — Other Ambulatory Visit: Payer: Self-pay | Admitting: Gynecology

## 2011-04-26 ENCOUNTER — Telehealth: Payer: Self-pay

## 2011-04-26 NOTE — Telephone Encounter (Signed)
Nov 2011 labs normal (including TSH) except for mild elev LDL chol - to follow lower chol diet  OK to send results of labs if pt requests

## 2011-04-26 NOTE — Telephone Encounter (Signed)
June 11 thyroid u/s negative for any change ; no new or enlarging or changing nodules seen

## 2011-04-26 NOTE — Telephone Encounter (Signed)
Patient had thyoid test recently and did not hear her results. She did call the phone tree and there were no results. Patient would like her results.

## 2011-04-26 NOTE — Telephone Encounter (Signed)
Called the patient informed of lab results. She did not mean lab results, but had a thyroid ultrasound a couple months ago and did not hear those results.

## 2011-04-29 NOTE — Telephone Encounter (Signed)
Called patient - informed of results 

## 2012-03-03 ENCOUNTER — Other Ambulatory Visit: Payer: Self-pay | Admitting: Gynecology

## 2012-08-14 IMAGING — US US SOFT TISSUE HEAD/NECK
1 series · 14 of 25 positions shown · non-contrast
Comparison: Ultrasound of the thyroid of 02/02/2010

CLINICAL DATA: Follow up of thyroid nodules

THYROID ULTRASOUND
TECHNIQUE: Ultrasound examination of the thyroid gland and adjacent
soft tissues was performed.

[Series 1: us soft tissue head/neck · 0.05mm/px · 14 of 48 slices shown]
[im 1/48]
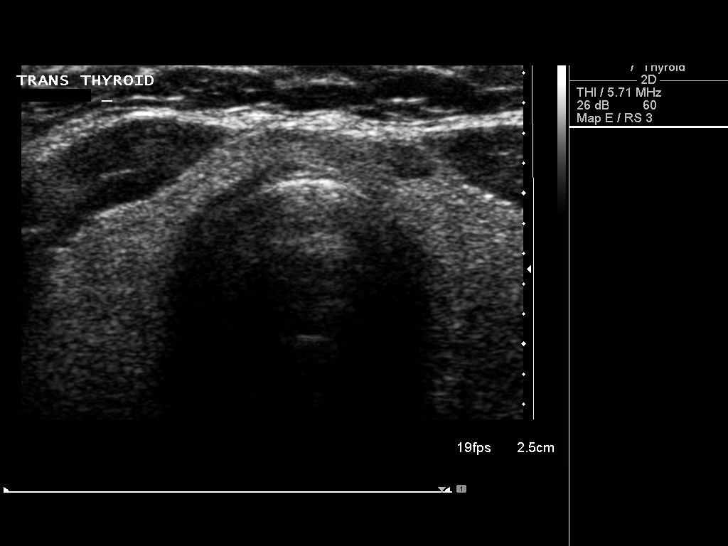
[im 4/48]
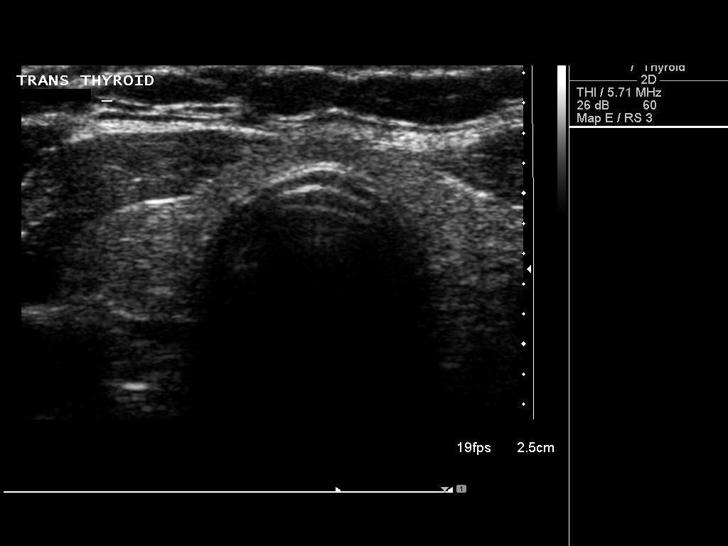
[im 8/48]
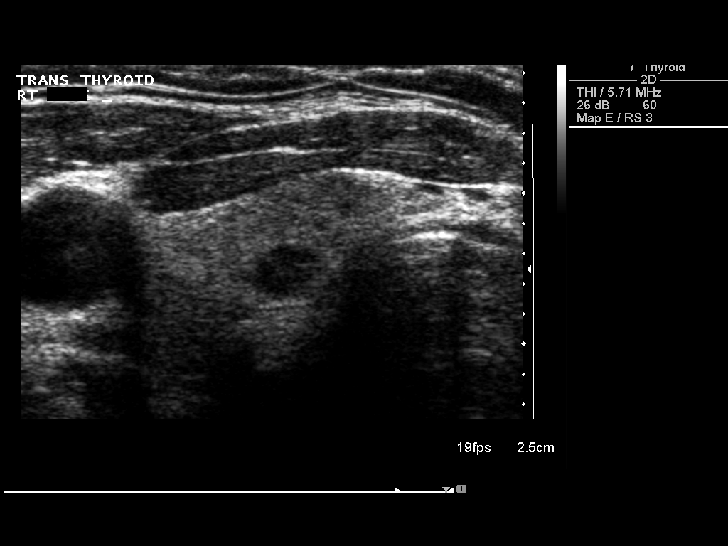
[im 12/48]
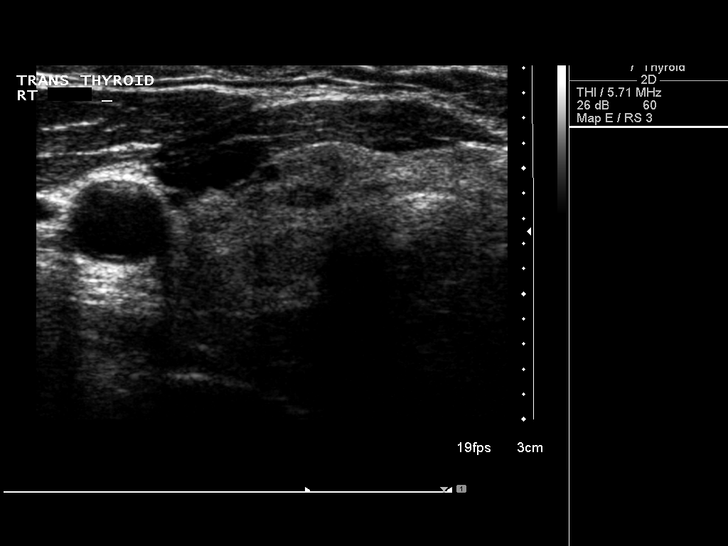
[im 16/48]
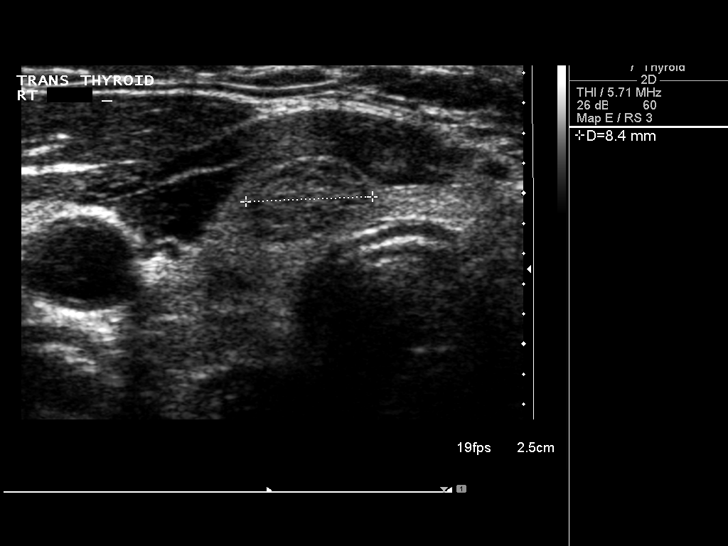
[im 18/48]
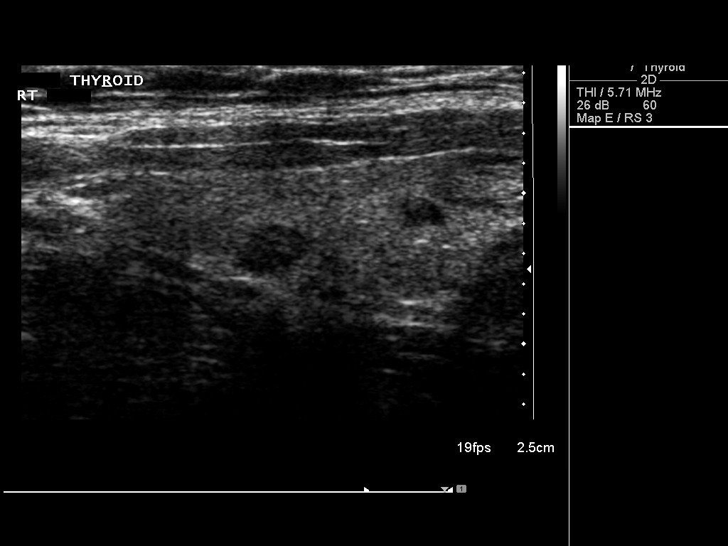
[im 22/48]
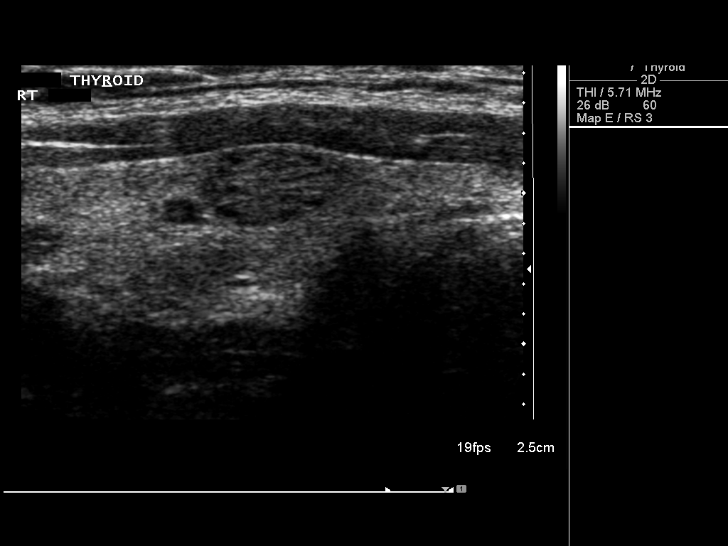
[im 26/48]
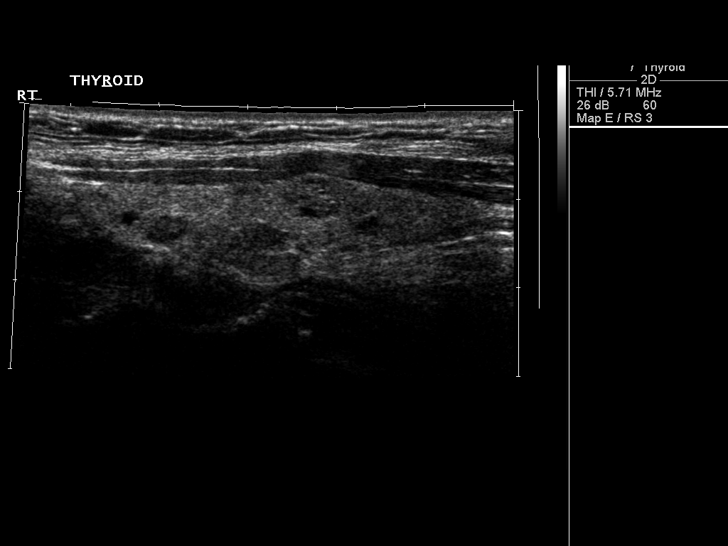
[im 30/48]
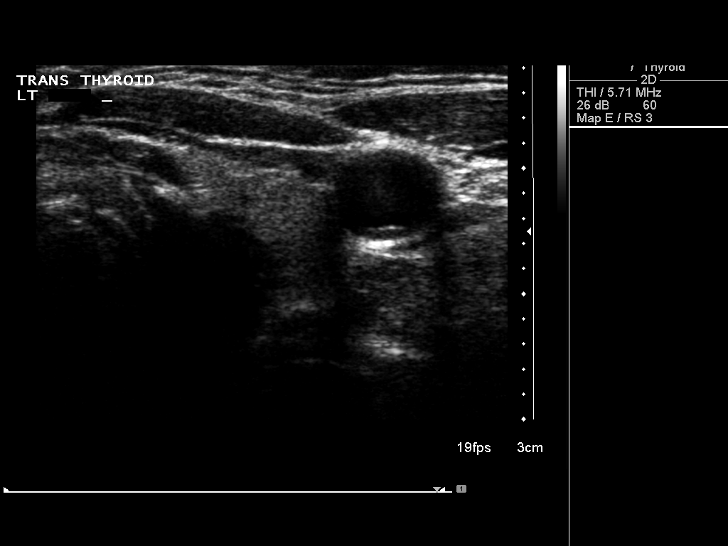
[im 32/48]
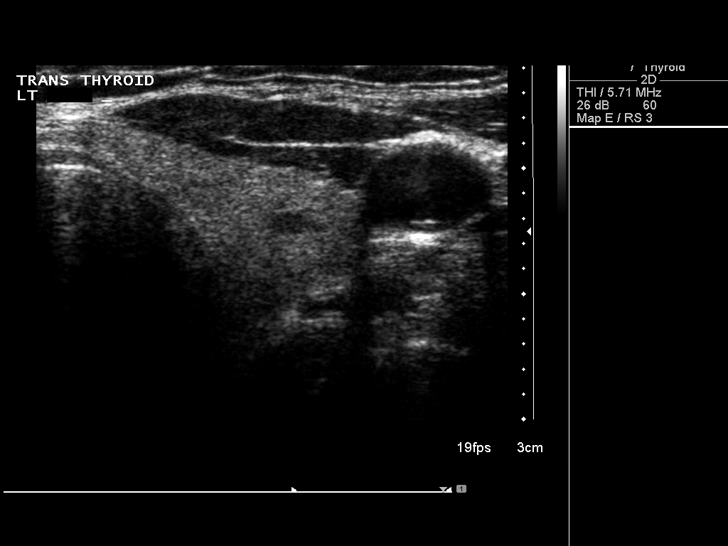
[im 36/48]
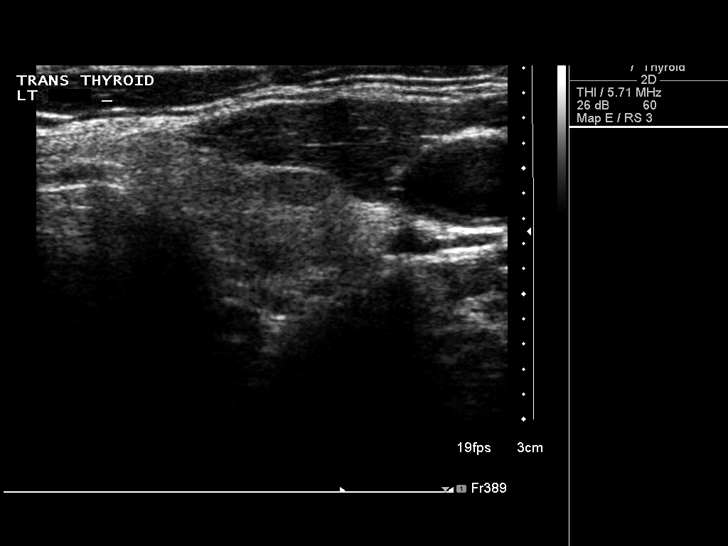
[im 40/48]
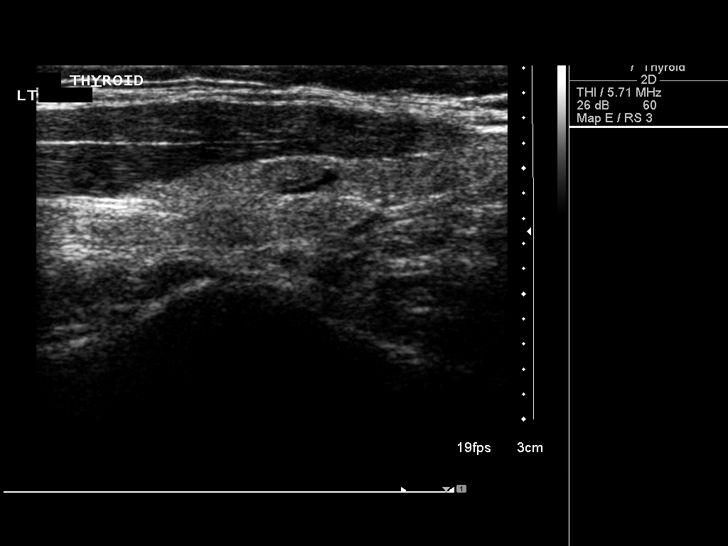
[im 44/48]
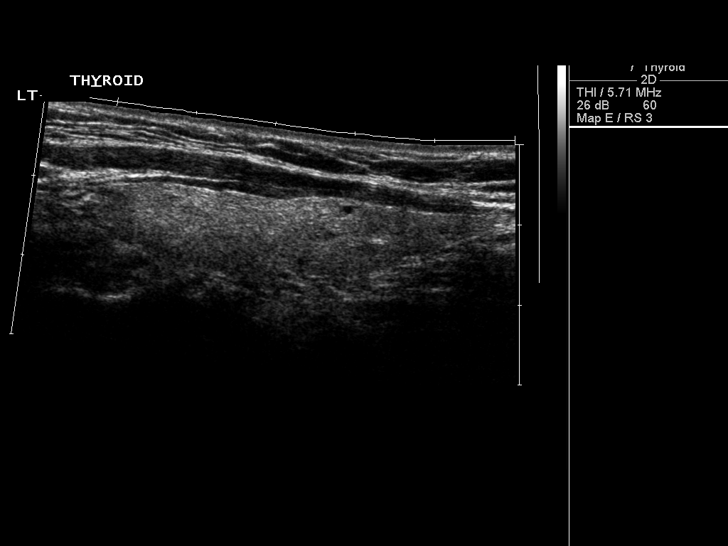
[im 48/48]
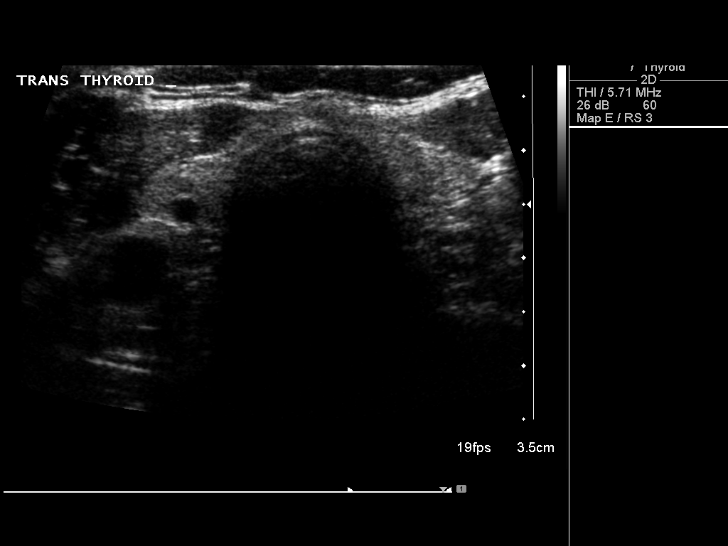

[14 of 25 positions shown; findings below may reference images not displayed]

FINDINGS: Right thyroid lobe:  5.0 x 1.3 x 1.4 cm.  (Previously 5.8 x 1.7 x
1.6 cm).
Left thyroid lobe:  5.0 x 1.2 x 1.4 cm.  (Previously 5.1 x 1.3 x
1.4 cm).
Isthmus:  2.6 mm in thickness.

Focal nodules:  The thyroid gland is somewhat inhomogeneous in
echogenicity.  Multiple small solid nodules are noted
bilaterally.The largest solid nodule is in the lower pole on the
right measuring 10 x 5 x 8 mm compared to 11 x 6 x 10 mm
previously.  The remainder of nodules measure no more than 6 mm in
diameter.  No new or enlarging nodule is seen.

Lymphadenopathy:  None visualized.
IMPRESSION: Multiple small thyroid nodules, none larger than 10 mm.  No new or
enlarging nodule is seen.

## 2012-08-28 ENCOUNTER — Encounter: Payer: Self-pay | Admitting: Internal Medicine

## 2012-08-28 ENCOUNTER — Ambulatory Visit (INDEPENDENT_AMBULATORY_CARE_PROVIDER_SITE_OTHER): Admitting: Internal Medicine

## 2012-08-28 VITALS — BP 164/90 | HR 76 | Temp 98.0°F | Resp 16 | Wt 145.0 lb

## 2012-08-28 DIAGNOSIS — J019 Acute sinusitis, unspecified: Secondary | ICD-10-CM

## 2012-08-28 MED ORDER — AMOXICILLIN-POT CLAVULANATE 875-125 MG PO TABS: 1.0000 | ORAL_TABLET | Freq: Two times a day (BID) | ORAL | Status: DC

## 2012-08-28 NOTE — Progress Notes (Signed)
HPI  Pt presents to the clinic with c/o sinus pain and pressure, headache, low grade fever and teeth pain x 2 weeks. She has not taken anything OTC for this, but just hoped it would resolve. The symptoms seem to be getting worse daily. She denies any history of allergies or asthma. She has had sick contacts.  Review of Systems   No past medical history on file.  No family history on file.  History   Social History  . Marital Status: Married    Spouse Name: N/A    Number of Children: N/A  . Years of Education: N/A   Occupational History  . Not on file.   Social History Main Topics  . Smoking status: Not on file  . Smokeless tobacco: Not on file  . Alcohol Use:   . Drug Use:   . Sexually Active:    Other Topics Concern  . Not on file   Social History Narrative  . No narrative on file    Allergies not on file   Constitutional: Positive headache, fatigue and fever. Denies abrupt weight changes.  HEENT:  Positive eye pain, pressure behind the eyes, facial pain, nasal congestion and sore throat. Denies eye redness, ear pain, ringing in the ears, wax buildup, runny nose or bloody nose. Respiratory: Positive cough and thick green sputum production. Denies difficulty breathing or shortness of breath.  Cardiovascular: Denies chest pain, chest tightness, palpitations or swelling in the hands or feet.   No other specific complaints in a complete review of systems (except as listed in HPI above).  Objective:    General: Appears her stated age, well developed, well nourished in NAD. HEENT: Head: normal shape and size; Eyes: sclera white, no icterus, conjunctiva pink, PERRLA and EOMs intact; Ears: Tm's gray and intact, normal light reflex; Nose: mucosa pink and moist, septum midline; Throat/Mouth: + PND. Teeth present, mucosa pink and moist, no exudate noted, no lesions or ulcerations noted.  Neck: Mild cervical lymphadenopathy. Neck supple, trachea midline. No massses, lumps or  thyromegaly present.  Cardiovascular: Normal rate and rhythm. S1,S2 noted.  No murmur, rubs or gallops noted. No JVD or BLE edema. No carotid bruits noted. Pulmonary/Chest: Normal effort and positive vesicular breath sounds. No respiratory distress. No wheezes, rales or ronchi noted.      Assessment & Plan:   Acute bacterial sinusitis  Can use a Neti Pot which can be purchased from your local drug store. Flonase 2 sprays each nostril for 3 days and then as needed. Augmentin BID for 10 days  RTC as needed or if symptoms persist.

## 2012-08-28 NOTE — Patient Instructions (Signed)

## 2012-09-21 ENCOUNTER — Encounter: Payer: Self-pay | Admitting: Gastroenterology

## 2013-02-10 ENCOUNTER — Ambulatory Visit (INDEPENDENT_AMBULATORY_CARE_PROVIDER_SITE_OTHER): Admitting: Internal Medicine

## 2013-02-10 ENCOUNTER — Encounter: Payer: Self-pay | Admitting: Internal Medicine

## 2013-02-10 VITALS — BP 128/80 | HR 79 | Temp 98.3°F | Ht 65.0 in | Wt 144.2 lb

## 2013-02-10 DIAGNOSIS — J029 Acute pharyngitis, unspecified: Secondary | ICD-10-CM

## 2013-02-10 DIAGNOSIS — J309 Allergic rhinitis, unspecified: Secondary | ICD-10-CM

## 2013-02-10 DIAGNOSIS — IMO0001 Reserved for inherently not codable concepts without codable children: Secondary | ICD-10-CM

## 2013-02-10 DIAGNOSIS — Z0000 Encounter for general adult medical examination without abnormal findings: Secondary | ICD-10-CM

## 2013-02-10 MED ORDER — AZITHROMYCIN 250 MG PO TABS: ORAL_TABLET | ORAL | Status: DC

## 2013-02-10 NOTE — Assessment & Plan Note (Signed)
stable overall by history and exam, recent data reviewed with pt, and pt to continue medical treatment as before,  to f/u any worsening symptoms or concerns Lab Results  Component Value Date   WBC 5.3 07/20/2010   HGB 14.9 07/20/2010   HCT 43.8 07/20/2010   PLT 258.0 07/20/2010   GLUCOSE 96 07/20/2010   CHOL 208* 07/20/2010   TRIG 90.0 07/20/2010   HDL 58.40 07/20/2010   LDLDIRECT 129.0 07/20/2010   ALT 20 07/20/2010   AST 25 07/20/2010   NA 140 07/20/2010   K 4.8 07/20/2010   CL 103 07/20/2010   CREATININE 0.8 07/20/2010   BUN 15 07/20/2010   CO2 29 07/20/2010   TSH 1.51 07/20/2010   For f/u next visit

## 2013-02-10 NOTE — Assessment & Plan Note (Signed)
Mild to mod, for antibx course,  to f/u any worsening symptoms or concern 

## 2013-02-10 NOTE — Progress Notes (Signed)
Subjective:    Patient ID: Joanne Prince, female    DOB: 01-30-1956, 57 y.o.   MRN: [id number]  HPI   Here with 2-3 days acute onset fever, severe ST and right ear pain, pressure, headache, general weakness and malaise, and greenish d/c, but pt denies chest pain, wheezing, increased sob or doe, orthopnea, PND, increased LE swelling, palpitations, dizziness or syncope. Does have some bilat ear popping, but no vertigo, off balance, n/v.  Does have several wks ongoing nasal allergy symptoms with clearish congestion, itch and sneezing, without fever, pain, ST, cough, swelling or wheezing, overall mild.   Has mild to mod myalgias but not as severe as previous FMS, advil helps Past Medical History  Diagnosis Date  . PERIPHERAL VASCULAR DISEASE 07/25/2009    Qualifier: Diagnosis of  By: Jonny Ruiz MD, Len Blalock   . OSTEOPENIA 07/25/2010    Qualifier: Diagnosis of  By: Jonny Ruiz MD, Bartolo Darter, LOCAL NOS, SHOULDER 07/01/2007    Qualifier: Diagnosis of  By: Jonny Ruiz MD, Len Blalock   . NEPHROLITHIASIS, HX OF 10/28/2007    Qualifier: Diagnosis of  By: Debby Bud MD, Rosalyn Gess   . MITRAL VALVE PROLAPSE, HX OF 07/01/2007    Qualifier: Diagnosis of  By: Jonny Ruiz MD, Len Blalock   . HYPERLIPIDEMIA 07/26/2010    Qualifier: Diagnosis of  By: Jonny Ruiz MD, Len Blalock   . GENITAL HERPES, HX OF 07/01/2007    Qualifier: Diagnosis of  By: Jonny Ruiz MD, Len Blalock   . 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 ALLERGIC RHINITIS 07/01/2007    Qualifier: Diagnosis of  By: Jonny Ruiz MD, Len Blalock    Past Surgical History  Procedure Laterality Date  . Cholecystectomy    . Uterine polypectomy      reports that she has never smoked. She does not have any smokeless tobacco history on file. She reports that she does not drink alcohol or use illicit drugs. family history includes Celiac disease in an unspecified family member;  Coronary artery disease in an unspecified family member; Hypertension in an unspecified family member; and Ovarian cancer in an unspecified family member. No Known Allergies Current Outpatient Prescriptions on File Prior to Visit  Medication Sig Dispense Refill  . estradiol (VIVELLE-DOT) 0.05 MG/24HR Place 1 patch onto the skin once a week.      . Progesterone Micronized (PROMETRIUM PO) Take by mouth every 3 (three) months.       No current facility-administered medications on file prior to visit.   Review of Systems  Constitutional: Negative for unexpected weight change, or unusual diaphoresis  HENT: Negative for tinnitus.   Eyes: Negative for photophobia and visual disturbance.  Respiratory: Negative for choking and stridor.   Gastrointestinal: Negative for vomiting and blood in stool.  Genitourinary: Negative for hematuria and decreased urine volume.  Musculoskeletal: Negative for acute joint swelling Skin: Negative for color change and wound.  Neurological: Negative for tremors and numbness other than noted  Psychiatric/Behavioral: Negative for decreased concentration or  hyperactivity.       Objective:   Physical Exam BP 128/80  Pulse 79  Temp(Src) 98.3 F (36.8 C) (Oral)  Ht 5\' 5"  (1.651 m)  Wt 144 lb 4 oz (65.431 kg)  BMI 24 kg/m2  SpO2 97% VS noted, mild ill Constitutional: Pt appears well-developed  and well-nourished.  HENT: Head: NCAT.  Right Ear: External ear normal.  Left Ear: External ear normal.  ;Bilat tm's with mild erythema.  Max sinus areas non tender.  Pharynx with mod erythema, + exudate Eyes: Conjunctivae and EOM are normal. Pupils are equal, round, and reactive to light.  Neck: Normal range of motion. Neck supple. with bilat submandibular LA tender Cardiovascular: Normal rate and regular rhythm.   Pulmonary/Chest: Effort normal and breath sounds normal.  Neurological: Pt is alert. Not confused  Skin: Skin is warm. No erythema.  Psychiatric: Pt  behavior is normal. Thought content normal.     Assessment & Plan:

## 2013-02-10 NOTE — Assessment & Plan Note (Signed)
Ok for [employer] prn,  to f/u any worsening symptoms or concerns

## 2013-02-10 NOTE — Patient Instructions (Addendum)
Please take all new medication as prescribed Please continue all other medications as before, and refills have been done if requested.   Please return in 6 months, or sooner if needed, with Lab testing done 3-5 days before  

## 2013-03-04 ENCOUNTER — Encounter: Payer: Self-pay | Admitting: Internal Medicine

## 2013-03-04 ENCOUNTER — Ambulatory Visit (INDEPENDENT_AMBULATORY_CARE_PROVIDER_SITE_OTHER): Admitting: Internal Medicine

## 2013-03-04 VITALS — BP 132/80 | HR 70 | Temp 98.1°F | Wt 147.1 lb

## 2013-03-04 DIAGNOSIS — E785 Hyperlipidemia, unspecified: Secondary | ICD-10-CM

## 2013-03-04 DIAGNOSIS — R5383 Other fatigue: Secondary | ICD-10-CM

## 2013-03-04 DIAGNOSIS — J029 Acute pharyngitis, unspecified: Secondary | ICD-10-CM

## 2013-03-04 DIAGNOSIS — J309 Allergic rhinitis, unspecified: Secondary | ICD-10-CM

## 2013-03-04 DIAGNOSIS — R5381 Other malaise: Secondary | ICD-10-CM

## 2013-03-04 MED ORDER — FEXOFENADINE HCL 180 MG PO TABS: 180.0000 mg | ORAL_TABLET | Freq: Every day | ORAL | Status: DC

## 2013-03-04 MED ORDER — METHYLPREDNISOLONE ACETATE 80 MG/ML IJ SUSP
80.0000 mg | Freq: Once | INTRAMUSCULAR | Status: AC
  Administered 2013-03-04: 80 mg via INTRAMUSCULAR

## 2013-03-04 MED ORDER — FLUTICASONE PROPIONATE 50 MCG/ACT NA SUSP: 2.0000 | Freq: Every day | NASAL | Status: DC

## 2013-03-04 NOTE — Patient Instructions (Signed)
You had the steroid shot today  Please take all new sample Dymista (steroid plus antihistamine) - 1 spray per side twice per day  After that - Please take all new medication as prescribed (the flonase and allegra as needed)

## 2013-03-04 NOTE — Progress Notes (Signed)
Subjective:    Patient ID: Joanne Prince, female    DOB: [birth year], 57 y.o.   MRN: [id number]  HPI  Here to f/u, no fever but Does have several wks ongoing nasal allergy symptoms with clearish congestion, itch and sneezing and internittent ST,, without fever, pain, cough, swelling or wheezing.   Pt denies fever, wt loss, night sweats, loss of appetite, or other constitutional symptoms  Pt denies chest pain, increased sob or doe, wheezing, orthopnea, PND, increased LE swelling, palpitations, dizziness or syncope.   Pt denies polydipsia, polyuria. Does c/o ongoing fatigue, but denies signficant daytime hypersomnolence. Past Medical History  Diagnosis Date  . PERIPHERAL VASCULAR DISEASE 07/25/2009    Qualifier: Diagnosis of  By: Jonny Ruiz MD, Len Blalock   . OSTEOPENIA 07/25/2010    Qualifier: Diagnosis of  By: Jonny Ruiz MD, Bartolo Darter, LOCAL NOS, SHOULDER 07/01/2007    Qualifier: Diagnosis of  By: Jonny Ruiz MD, Len Blalock   . NEPHROLITHIASIS, HX OF 10/28/2007    Qualifier: Diagnosis of  By: Debby Bud MD, Rosalyn Gess   . MITRAL VALVE PROLAPSE, HX OF 07/01/2007    Qualifier: Diagnosis of  By: Jonny Ruiz MD, Len Blalock   . HYPERLIPIDEMIA 07/26/2010    Qualifier: Diagnosis of  By: Jonny Ruiz MD, Len Blalock   . GENITAL HERPES, HX OF 07/01/2007    Qualifier: Diagnosis of  By: Jonny Ruiz MD, Len Blalock   . 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 ALLERGIC RHINITIS 07/01/2007    Qualifier: Diagnosis of  By: Jonny Ruiz MD, Len Blalock    Past Surgical History  Procedure Laterality Date  . Cholecystectomy    . Uterine polypectomy      reports that she has never smoked. She does not have any smokeless tobacco history on file. She reports that she does not drink alcohol or use illicit drugs. family history includes Celiac disease in an unspecified family member; Coronary artery disease in an unspecified family member;  Hypertension in an unspecified family member; and Ovarian cancer in an unspecified family member. No Known Allergies Current Outpatient Prescriptions on File Prior to Visit  Medication Sig Dispense Refill  . estradiol (VIVELLE-DOT) 0.05 MG/24HR Place 1 patch onto the skin once a week.      . Progesterone Micronized (PROMETRIUM PO) Take by mouth every 3 (three) months.       No current facility-administered medications on file prior to visit.   Review of Systems  Constitutional: Negative for unexpected weight change, or unusual diaphoresis  HENT: Negative for tinnitus.   Eyes: Negative for photophobia and visual disturbance.  Respiratory: Negative for choking and stridor.   Gastrointestinal: Negative for vomiting and blood in stool.  Genitourinary: Negative for hematuria and decreased urine volume.  Musculoskeletal: Negative for acute joint swelling Skin: Negative for color change and wound.  Neurological: Negative for tremors and numbness other than noted  Psychiatric/Behavioral: Negative for decreased concentration or  hyperactivity.       Objective:   Physical Exam BP 132/80  Pulse 70  Temp(Src) 98.1 F (36.7 C) (Oral)  Wt 147 lb 2 oz (66.735 kg)  BMI 24.48 kg/m2  SpO2 96% VS noted,  Constitutional: Pt appears well-developed and well-nourished.  HENT: Head: NCAT.  Right Ear: External ear normal.  Left Ear: External ear normal.  Bilat tm's with  mild erythema.  Max sinus areas non tender.  Pharynx with mild erythema, no exudate Eyes: Conjunctivae and EOM are normal. Pupils are equal, round, and reactive to light.  Neck: Normal range of motion. Neck supple.  Cardiovascular: Normal rate and regular rhythm.   Pulmonary/Chest: Effort normal and breath sounds normal.  Abd:  Soft, NT, non-distended, + BS Neurological: Pt is alert. Not confused  Skin: Skin is warm. No erythema.  Psychiatric: Pt behavior is normal. Thought content normal. Mild nervous     Assessment & Plan:

## 2013-03-05 NOTE — Assessment & Plan Note (Signed)
stable overall by history and exam, recent data reviewed with pt, and pt to continue medical treatment as before,  to f/u any worsening symptoms or concerns Lab Results  Component Value Date   CHOL 208* 07/20/2010   HDL 58.40 07/20/2010   LDLDIRECT 129.0 07/20/2010   TRIG 90.0 07/20/2010   CHOLHDL 4 07/20/2010   To cont diet

## 2013-03-05 NOTE — Assessment & Plan Note (Signed)
Most recent resolved, no need further antibx at this time

## 2013-03-05 NOTE — Assessment & Plan Note (Signed)
Mild to mod, for depomedrol IM, and dymista sample, then flonase/allegra after,  to f/u any worsening symptoms or concerns

## 2013-03-05 NOTE — Assessment & Plan Note (Signed)
Etiology unclear, Exam otherwise benign, most recent labs reviewed with pt, exam o/w benign, follow with expectant management

## 2013-03-08 ENCOUNTER — Ambulatory Visit (INDEPENDENT_AMBULATORY_CARE_PROVIDER_SITE_OTHER): Admitting: Gynecology

## 2013-03-08 ENCOUNTER — Encounter: Payer: Self-pay | Admitting: Gynecology

## 2013-03-08 ENCOUNTER — Other Ambulatory Visit (HOSPITAL_COMMUNITY)
Admission: RE | Admit: 2013-03-08 | Discharge: 2013-03-08 | Disposition: A | Discharge status: Home / Self Care | Source: Ambulatory Visit | Attending: Gynecology | Admitting: Gynecology

## 2013-03-08 VITALS — BP 148/92 | Ht 64.5 in | Wt 145.0 lb

## 2013-03-08 DIAGNOSIS — M899 Disorder of bone, unspecified: Secondary | ICD-10-CM

## 2013-03-08 DIAGNOSIS — IMO0001 Reserved for inherently not codable concepts without codable children: Secondary | ICD-10-CM

## 2013-03-08 DIAGNOSIS — IMO0002 Reserved for concepts with insufficient information to code with codable children: Secondary | ICD-10-CM

## 2013-03-08 DIAGNOSIS — R6889 Other general symptoms and signs: Secondary | ICD-10-CM

## 2013-03-08 DIAGNOSIS — Z01419 Encounter for gynecological examination (general) (routine) without abnormal findings: Secondary | ICD-10-CM | POA: Insufficient documentation

## 2013-03-08 DIAGNOSIS — Z1151 Encounter for screening for human papillomavirus (HPV): Secondary | ICD-10-CM | POA: Insufficient documentation

## 2013-03-08 DIAGNOSIS — M8580 Other specified disorders of bone density and structure, unspecified site: Secondary | ICD-10-CM

## 2013-03-08 DIAGNOSIS — Z79890 Hormone replacement therapy (postmenopausal): Secondary | ICD-10-CM

## 2013-03-08 MED ORDER — ESTRADIOL 0.05 MG/24HR TD PTTW: 1.0000 | MEDICATED_PATCH | TRANSDERMAL | Status: DC

## 2013-03-08 MED ORDER — PROGESTERONE MICRONIZED 100 MG PO CAPS: 100.0000 mg | ORAL_CAPSULE | Freq: Every day | ORAL | Status: DC

## 2013-03-08 NOTE — Progress Notes (Signed)
Joanne Prince 03-19-1956 [id number]        57 y.o.  G2P2 for annual exam.  Former patient of Dr. Nicholas Lose. Several issues noted below.  Past medical history,surgical history, medications, allergies, family history and social history were all reviewed and documented in the EPIC chart.  ROS:  Performed and pertinent positives and negatives are included in the history, assessment and plan .  Exam: Biomedical scientist Filed Vitals:   03/08/13 0858  BP: 148/92  Height: 5' 4.5" (1.638 m)  Weight: 145 lb (65.772 kg)   General appearance  Normal Skin grossly normal Head/Neck normal with no cervical or supraclavicular adenopathy thyroid normal Lungs  clear Cardiac RR, without RMG Abdominal  soft, nontender, without masses, organomegaly or hernia Breasts  examined lying and sitting without masses, retractions, discharge or axillary adenopathy. Pelvic  Ext/BUS/vagina  normal   Cervix  normal Pap/HPV  Uterus  anteverted, normal size, shape and contour, midline and mobile nontender   Adnexa  Without masses or tenderness    Anus and perineum  normal   Rectovaginal  normal sphincter tone without palpated masses or tenderness.    Assessment/Plan:  57 y.o. G2P2 female for annual exam.   1. HRT.  Patient is on Vivelle 0.05 patch and Prometrium every 3 months withdrawal for 12 days. Notes no bleeding with the withdrawal. Was started initially do to menopausal symptoms of hot flashes and sweats as well as emotional lability and overall not feeling well. Since then has been doing well and she wants to continue.  I reviewed the whole issue of HRT with her to include the WHI study with increased risk of stroke, heart attack, DVT and breast cancer. The ACOG and NAMS statements for lowest dose for the shortest period of time reviewed. Transdermal versus oral first-pass effect benefit discussed.  I also discussed taking progesterone every day versus every 3 months withdrawal option. Patient wants to go ahead with  this and we'll plan on Vivelle 0.05 mg patches with Prometrium 100 mg at bedtime. She knows to report any bleeding. 2. Dyspareunia. Patient noted some dyspareunia with intercourse. No chronic vaginal dryness or other vaginal symptoms. Options to adjust transdermal estrogen, and vaginal estrogen or trial of vaginal lubricants with intercourse discussed. Patient will try vaginal lubricants and see how she does with this. She'll followup if any issues.  3. ASCUS. Pap smear 03/2012 showed ASCUS negative high-risk HPV. No history of abnormal Pap smears previously. Pap/HPV done today. 4. Osteopenia. reported through Dr. Nicholas Lose office a number of years ago. Has not had a DEXA for a number of years. Schedule baseline DEXA now. Increase calcium vitamin D reviewed.  5. Herpes. Reports positive blood test but no clinical outbreak. Unsure why blood test was done originally but I reviewed with her in the absence of clinical symptoms she may have had herpes labialis and not genitalis. Regardless I reviewed the signs and symptoms and she'll report any outbreaks.  6. Mammography July 2013. Patient to schedule mammography now. SBE monthly reviewed.  7. Colonoscopy 2006 .  Recommended to repeat in 2 years by gastroenterology.  8. Health maintenance. Sees Dr. Oliver Barre routinely and does her blood work. Blood pressure today 140/92. She does report whitecoat hypertension. She took her own blood pressure this morning before coming in it was 120/69. Patient will continue to follow at home as long as normal then we'll follow. She does have listed peripheral vascular disease in her medical history by Dr. Jonny Ruiz. Asked her to  question him about this as she has no idea where this diagnosis came from it she's going to call his office to follow up with him in reference to this. Followup in one year, sooner as needed.    Note: This document was prepared with digital dictation and possible smart phrase technology. Any transcriptional  errors that result from this process are unintentional.   Dara Lords MD, 9:36 AM 03/08/2013

## 2013-03-08 NOTE — Patient Instructions (Signed)
Ask Dr. Jonny Ruiz about the diagnosis "peripheral vascular disease" Followup with me in one year, sooner if any issues.

## 2013-03-09 ENCOUNTER — Telehealth: Payer: Self-pay | Admitting: *Deleted

## 2013-03-09 LAB — URINALYSIS W MICROSCOPIC + REFLEX CULTURE
Glucose, UA: NEGATIVE mg/dL
Hgb urine dipstick: NEGATIVE
Leukocytes, UA: NEGATIVE
Protein, ur: NEGATIVE mg/dL

## 2013-03-09 NOTE — Telephone Encounter (Signed)
Pt called states she was unaware she had a diagnosis of Peripheral Vascular Disease.  She would like Dr Jonny Ruiz or Zella Ball to call her to discuss this.  Please advise.

## 2013-03-10 NOTE — Telephone Encounter (Signed)
i suspect this might have been an error in the conversin to the current computer record  I will delete

## 2013-03-10 NOTE — Telephone Encounter (Signed)
Patient informed. 

## 2013-04-05 ENCOUNTER — Encounter: Payer: Self-pay | Admitting: Gynecology

## 2013-04-08 ENCOUNTER — Encounter: Payer: Self-pay | Admitting: Gynecology

## 2013-04-08 ENCOUNTER — Ambulatory Visit (INDEPENDENT_AMBULATORY_CARE_PROVIDER_SITE_OTHER)

## 2013-04-08 DIAGNOSIS — M899 Disorder of bone, unspecified: Secondary | ICD-10-CM

## 2013-04-08 DIAGNOSIS — M8580 Other specified disorders of bone density and structure, unspecified site: Secondary | ICD-10-CM

## 2013-07-08 ENCOUNTER — Other Ambulatory Visit: Payer: Self-pay

## 2013-07-10 ENCOUNTER — Encounter: Payer: Self-pay | Admitting: Family Medicine

## 2013-07-10 ENCOUNTER — Ambulatory Visit (INDEPENDENT_AMBULATORY_CARE_PROVIDER_SITE_OTHER): Admitting: Family Medicine

## 2013-07-10 VITALS — BP 132/80 | HR 80 | Temp 98.0°F | Wt 144.8 lb

## 2013-07-10 DIAGNOSIS — J020 Streptococcal pharyngitis: Secondary | ICD-10-CM

## 2013-07-10 DIAGNOSIS — H6690 Otitis media, unspecified, unspecified ear: Secondary | ICD-10-CM

## 2013-07-10 DIAGNOSIS — H6691 Otitis media, unspecified, right ear: Secondary | ICD-10-CM

## 2013-07-10 DIAGNOSIS — Z23 Encounter for immunization: Secondary | ICD-10-CM

## 2013-07-10 DIAGNOSIS — J029 Acute pharyngitis, unspecified: Secondary | ICD-10-CM

## 2013-07-10 DIAGNOSIS — K120 Recurrent oral aphthae: Secondary | ICD-10-CM

## 2013-07-10 LAB — POCT RAPID STREP A (OFFICE): Rapid Strep A Screen: NEGATIVE

## 2013-07-10 MED ORDER — AMOXICILLIN-POT CLAVULANATE 875-125 MG PO TABS: ORAL_TABLET | ORAL | Status: DC

## 2013-07-10 NOTE — Progress Notes (Signed)
OFFICE NOTE  07/10/2013  CC:  Chief Complaint  Patient presents with  . Sore Throat    Pt would like flu shot if its ok to get with her sxs  . Otalgia    Right     HPI: Patient is a 57 y.o. Caucasian female who is here for irritated feeling in throat and right ear, onset a few days ago. No nasal cong/runny nose, no PND, no cough.  No fever. No body aches, no fatigue.  No rash.  Appetite normal. Denies GERD.  Uses Q tips. Denies decreased hearing.  Reports hx of recurrent right ear infections, says she thinks she has scar tissue in right middle ear or ear drum. Has not seen an ENT.  Pertinent PMH:  Past Medical History  Diagnosis Date  . PERIPHERAL VASCULAR DISEASE 07/25/2009    Qualifier: Diagnosis of  By: Jonny Ruiz MD, Len Blalock   . OSTEOPENIA 04/2013    T score -1.6 FRAX 6.8%/0.6%  . 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 NEPHROLITHIASIS, HX OF 10/28/2007    Qualifier: Diagnosis of  By: Debby Bud MD, Rosalyn Gess   . MITRAL VALVE PROLAPSE, HX OF 07/01/2007    Qualifier: Diagnosis of  By: Jonny Ruiz MD, Len Blalock   . HYPERLIPIDEMIA 07/26/2010    Qualifier: Diagnosis of  By: Jonny Ruiz MD, Len Blalock   . GENITAL HERPES, HX OF 07/01/2007    Qualifier: Diagnosis of  By: Jonny Ruiz MD, Len Blalock   . 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 ALLERGIC RHINITIS 07/01/2007    Qualifier: Diagnosis of  By: Jonny Ruiz MD, Len Blalock    Past Surgical History  Procedure Laterality Date  . Cholecystectomy    . Uterine polypectomy    . Lasix Bilateral     MEDS:  Outpatient Prescriptions Prior to Visit  Medication Sig Dispense Refill  . Biotin 5000 MCG CAPS Take by mouth.      . cholecalciferol (VITAMIN D) 1000 UNITS tablet Take 1,000 Units by mouth daily.      Marland Kitchen estradiol (VIVELLE-DOT) 0.05 MG/24HR Place 1 patch (0.05 mg total) onto the  skin once a week.  8 patch  11  . fexofenadine (ALLEGRA) 180 MG tablet Take 1 tablet (180 mg total) by mouth daily.  30 tablet  5  . fluticasone (FLONASE) 50 MCG/ACT nasal spray Place 2 sprays into the nose daily.  16 g  5  . progesterone (PROMETRIUM) 100 MG capsule Take 1 capsule (100 mg total) by mouth daily. At bedtime  30 capsule  11   No facility-administered medications prior to visit.    PE: Blood pressure 132/80, pulse 80, temperature 98 F (36.7 C), temperature source Oral, weight 144 lb 12.8 oz (65.681 kg), last menstrual period 03/08/2005, SpO2 90.00%. Gen: Alert, well appearing.  Patient is oriented to person, place, time, and situation. AFFECT: pleasant, lucid thought and speech. ENT: Ears: EACs clear, normal epithelium.  Left TM with good light reflex and landmarks. Right TM with distorted landmarks although the TM is intact.  There is either loculated middle ear fluid visible behind the TM vs cholesteatoma.  Minimal TM injection.  Right EAC normal.   Eyes: no injection, icteris, swelling, or exudate.  EOMI, PERRLA. Nose: no drainage or turbinate  edema/swelling.  No injection or focal lesion.  Mouth: lips without lesion/swelling.  Oral mucosa pink and moist.  Dentition intact and without obvious caries or gingival swelling.  Oropharynx without exudate or swelling but there is mild erythema and there is a 1 mm oval, superficial ulcer on right post pharyngeal wall. Neck - No masses or thyromegaly or limitation in range of motion. CV: RRR, no m/r/g.   LUNGS: CTA bilat, nonlabored resps, good aeration in all lung fields.  LAB: rapid strep NEG  IMPRESSION AND PLAN:  Right middle ear abnormality: either loculated effusion/pus vs possible early cholesteatoma. Plan is to treat with 7d of augmentin and have her return for recheck of ear in 10d.   Right sided pharyngeal aphthous ulcer--reassured pt of self limited nature of this. Symptomatic care discussed.   FOLLOW UP:  10d

## 2013-07-12 ENCOUNTER — Telehealth: Payer: Self-pay | Admitting: *Deleted

## 2013-07-12 NOTE — Telephone Encounter (Signed)
To[facility]oma Schanz Fax: [phone] From: [facility] Date/ Time: 07/10/2013 9:39 AM Taken By: Annalee Genta, CSR Caller: Sorcha Facility: not collected Patient: Joanne Prince, Joanne Prince DOB: 11-Apr-1956 Phone: [phone] Reason for Call: See info below Regarding Appointment: Yes Appt Date: 07/10/2013 Appt Time: 11:15:00 AM Provider: Reason: Details: Sore Throat Outcome: Scheduled appointment in

## 2013-07-21 ENCOUNTER — Ambulatory Visit (INDEPENDENT_AMBULATORY_CARE_PROVIDER_SITE_OTHER): Admitting: Internal Medicine

## 2013-07-21 ENCOUNTER — Encounter: Payer: Self-pay | Admitting: Internal Medicine

## 2013-07-21 VITALS — BP 152/80 | HR 91 | Temp 97.3°F | Ht 65.0 in | Wt 147.8 lb

## 2013-07-21 DIAGNOSIS — J309 Allergic rhinitis, unspecified: Secondary | ICD-10-CM

## 2013-07-21 DIAGNOSIS — J029 Acute pharyngitis, unspecified: Secondary | ICD-10-CM

## 2013-07-21 NOTE — Assessment & Plan Note (Signed)
Resolved,  to f/u any worsening symptoms or concerns  

## 2013-07-21 NOTE — Assessment & Plan Note (Signed)
For med restart,  to f/u any worsening symptoms or concerns  

## 2013-07-21 NOTE — Progress Notes (Signed)
Subjective:    Patient ID: Joanne Prince, female    DOB: 1956/07/06, 57 y.o.   MRN: [id number]  HPI  Here to f/u, sinus and ear symtpoms have resolved from last visit and tx nov 8. Pt denies chest pain, increased sob or doe, wheezing, orthopnea, PND, increased LE swelling, palpitations, dizziness or syncope.  Pt denies new neurological symptoms such as new headache, or facial or extremity weakness or numbness   Pt denies polydipsia, polyuria, Has ongoing allergies - not using meds recently. Past Medical History  Diagnosis Date  . PERIPHERAL VASCULAR DISEASE 07/25/2009    Qualifier: Diagnosis of  By: Jonny Ruiz MD, Len Blalock   . OSTEOPENIA 04/2013    T score -1.6 FRAX 6.8%/0.6%  . 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 NEPHROLITHIASIS, HX OF 10/28/2007    Qualifier: Diagnosis of  By: Debby Bud MD, Rosalyn Gess   . MITRAL VALVE PROLAPSE, HX OF 07/01/2007    Qualifier: Diagnosis of  By: Jonny Ruiz MD, Len Blalock   . HYPERLIPIDEMIA 07/26/2010    Qualifier: Diagnosis of  By: Jonny Ruiz MD, Len Blalock   . GENITAL HERPES, HX OF 07/01/2007    Qualifier: Diagnosis of  By: Jonny Ruiz MD, Len Blalock   . 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 ALLERGIC RHINITIS 07/01/2007    Qualifier: Diagnosis of  By: Jonny Ruiz MD, Len Blalock    Past Surgical History  Procedure Laterality Date  . Cholecystectomy    . Uterine polypectomy    . Lasix Bilateral     reports that she has never smoked. She does not have any smokeless tobacco history on file. She reports that she does not drink alcohol or use illicit drugs. family history includes Celiac disease in an other family member; Coronary artery disease in an other family member; Hypertension in an other family member; Ovarian cancer in an other family member. No Known Allergies Current Outpatient Prescriptions on File  Prior to Visit  Medication Sig Dispense Refill  . Biotin 5000 MCG CAPS Take by mouth.      . cholecalciferol (VITAMIN D) 1000 UNITS tablet Take 1,000 Units by mouth daily.      Marland Kitchen estradiol (VIVELLE-DOT) 0.05 MG/24HR Place 1 patch (0.05 mg total) onto the skin once a week.  8 patch  11  . fexofenadine (ALLEGRA) 180 MG tablet Take 1 tablet (180 mg total) by mouth daily.  30 tablet  5  . fluticasone (FLONASE) 50 MCG/ACT nasal spray Place 2 sprays into the nose daily.  16 g  5  . progesterone (PROMETRIUM) 100 MG capsule Take 1 capsule (100 mg total) by mouth daily. At bedtime  30 capsule  11   No current facility-administered medications on file prior to visit.   Review of Systems All otherwise neg per pt     Objective:   Physical Exam BP 152/80  Pulse 91  Temp(Src) 97.3 F (36.3 C) (Oral)  Ht 5\' 5"  (1.651 m)  Wt 147 lb 12 oz (67.019 kg)  BMI 24.59 kg/m2  SpO2 96%  LMP 03/08/2005 VS noted,  Constitutional: Pt appears well-developed and well-nourished.  HENT: Head: NCAT.  Right Ear: External ear normal.  Left Ear: External ear normal.  Eyes: Conjunctivae and EOM are normal. Pupils are  equal, round, and reactive to light.  Neck: Normal range of motion. Neck supple.  Cardiovascular: Normal rate and regular rhythm.   Pulmonary/Chest: Effort normal and breath sounds normal.  Neurological: Pt is alert. Not confused  Skin: Skin is warm. No erythema.  Psychiatric: Pt behavior is normal. Thought content normal.      Assessment & Plan:

## 2013-07-21 NOTE — Patient Instructions (Signed)
You are improved today, no need for further treatment  Please continue all other medications as before

## 2013-10-22 ENCOUNTER — Ambulatory Visit (INDEPENDENT_AMBULATORY_CARE_PROVIDER_SITE_OTHER): Admitting: Internal Medicine

## 2013-10-22 ENCOUNTER — Encounter: Payer: Self-pay | Admitting: Internal Medicine

## 2013-10-22 VITALS — BP 110/80 | HR 87 | Temp 97.0°F | Ht 65.0 in | Wt 147.8 lb

## 2013-10-22 DIAGNOSIS — H6990 Unspecified Eustachian tube disorder, unspecified ear: Secondary | ICD-10-CM | POA: Insufficient documentation

## 2013-10-22 DIAGNOSIS — M7711 Lateral epicondylitis, right elbow: Secondary | ICD-10-CM

## 2013-10-22 DIAGNOSIS — J069 Acute upper respiratory infection, unspecified: Secondary | ICD-10-CM

## 2013-10-22 DIAGNOSIS — H6980 Other specified disorders of Eustachian tube, unspecified ear: Secondary | ICD-10-CM

## 2013-10-22 DIAGNOSIS — M7710 Lateral epicondylitis, unspecified elbow: Secondary | ICD-10-CM

## 2013-10-22 MED ORDER — AZITHROMYCIN 250 MG PO TABS: ORAL_TABLET | ORAL | Status: DC

## 2013-10-22 NOTE — Progress Notes (Signed)
Pre-visit discussion using our clinic review tool. No additional management support is needed unless otherwise documented below in the visit note.  

## 2013-10-22 NOTE — Patient Instructions (Signed)
Please take all new medication as prescribed - the antibiotic You can also take Delsym OTC for cough, and/or Mucinex (or it's generic off brand) for congestion, and tylenol as needed for pain.  Please continue all other medications as before, and refills have been done if requested. You can also use the alleve or tylenol as needed, and the forearm band for the elbow as needed for pain  Please remember to sign up for MyChart if you have not done so, as this will be important to you in the future with finding out test results, communicating by private email, and scheduling acute appointments online when needed.

## 2013-10-23 NOTE — Assessment & Plan Note (Signed)
Mild to mod, for antibx course,  to f/u any worsening symptoms or concerns 

## 2013-10-23 NOTE — Assessment & Plan Note (Signed)
Mild, for alleve, forearm band prn,  to f/u any worsening symptoms or concerns

## 2013-10-23 NOTE — Progress Notes (Signed)
Subjective:    Patient ID: Joanne Prince, female    DOB: [birth year], 58 y.o.   MRN: [id number]  HPI   Here with 2-3 days acute onset fever, facial pain, pressure, headache, general weakness and malaise, and greenish d/c, with mild ST and cough, but pt denies chest pain, wheezing, increased sob or doe, orthopnea, PND, increased LE swelling, palpitations, dizziness or syncope.  Also with bilat ear popping and crackling, no hearing loss or tendonitis, but always right ear worse than left.  Also with mild tender area right elbow, no trauma or swelling. Past Medical History  Diagnosis Date  . PERIPHERAL VASCULAR DISEASE 07/25/2009    Qualifier: Diagnosis of  By: Jenny Reichmann MD, Hunt Oris   . OSTEOPENIA 04/2013    T score -1.6 FRAX 6.8%/0.6%  . 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 NEPHROLITHIASIS, HX OF 10/28/2007    Qualifier: Diagnosis of  By: Linda Hedges MD, Heinz Knuckles   . MITRAL VALVE PROLAPSE, HX OF 07/01/2007    Qualifier: Diagnosis of  By: Jenny Reichmann MD, Hunt Oris   . HYPERLIPIDEMIA 07/26/2010    Qualifier: Diagnosis of  By: Jenny Reichmann MD, [place], HX OF 07/01/2007    Qualifier: Diagnosis of  By: Jenny Reichmann MD, Hunt Oris   . 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 ALLERGIC RHINITIS 07/01/2007    Qualifier: Diagnosis of  By: Jenny Reichmann MD, Hunt Oris    Past Surgical History  Procedure Laterality Date  . Cholecystectomy    . Uterine polypectomy    . Lasix Bilateral     reports that she has never smoked. She does not have any smokeless tobacco history on file. She reports that she does not drink alcohol or use illicit drugs. family history includes Celiac disease in an other family member; Coronary artery disease in an other family member; Hypertension in an other family member; Ovarian cancer in an other family  member. No Known Allergies Current Outpatient Prescriptions on File Prior to Visit  Medication Sig Dispense Refill  . Biotin 5000 MCG CAPS Take by mouth.      . cholecalciferol (VITAMIN D) 1000 UNITS tablet Take 1,000 Units by mouth daily.      Marland Kitchen estradiol (VIVELLE-DOT) 0.05 MG/24HR Place 1 patch (0.05 mg total) onto the skin once a week.  8 patch  11  . fexofenadine (ALLEGRA) 180 MG tablet Take 1 tablet (180 mg total) by mouth daily.  30 tablet  5  . fluticasone (FLONASE) 50 MCG/ACT nasal spray Place 2 sprays into the nose daily.  16 g  5  . progesterone (PROMETRIUM) 100 MG capsule Take 1 capsule (100 mg total) by mouth daily. At bedtime  30 capsule  11   No current facility-administered medications on file prior to visit.   Review of Systems  Constitutional: Negative for unexpected weight change, or unusual diaphoresis  HENT: Negative for tinnitus.   Eyes: Negative for photophobia and visual disturbance.  Respiratory: Negative for choking and stridor.   Gastrointestinal: Negative for vomiting and blood in stool.  Genitourinary: Negative for hematuria and decreased urine volume.  Musculoskeletal: Negative for acute joint swelling Skin: Negative for color change and wound.  Neurological: Negative for tremors and numbness other than  noted  Psychiatric/Behavioral: Negative for decreased concentration or  hyperactivity.  '    Objective:   Physical Exam BP 110/80  Pulse 87  Temp(Src) 97 F (36.1 C) (Oral)  Ht 5\' 5"  (1.651 m)  Wt 147 lb 12 oz (67.019 kg)  BMI 24.59 kg/m2  SpO2 96%  LMP 03/08/2005 VS noted, mild ill Constitutional: Pt appears well-developed and well-nourished.  HENT: Head: NCAT.  Right Ear: External ear normal.  Left Ear: External ear normal.  Eyes: Conjunctivae and EOM are normal. Pupils are equal, round, and reactive to light.  Bilat tm's with mild erythema.  Max sinus areas mild tender.  Pharynx with mild erythema, no exudate Neck: Normal range of motion. Neck  supple.  Cardiovascular: Normal rate and regular rhythm.   Pulmonary/Chest: Effort normal and breath sounds normal.  Neurological: Pt is alert. Not confused  Right elbow tender at lateral epicondyle without swelling, arm o/w neurovasc intact Skin: Skin is warm. No erythema.  Psychiatric: Pt behavior is normal. Thought content normal.     Assessment & Plan:

## 2013-10-23 NOTE — Assessment & Plan Note (Signed)
For mucinex otc prn 

## 2014-03-09 ENCOUNTER — Other Ambulatory Visit (HOSPITAL_COMMUNITY)
Admission: RE | Admit: 2014-03-09 | Discharge: 2014-03-09 | Disposition: A | Discharge status: Home / Self Care | Source: Ambulatory Visit | Attending: Gynecology | Admitting: Gynecology

## 2014-03-09 ENCOUNTER — Encounter: Payer: Self-pay | Admitting: Gynecology

## 2014-03-09 ENCOUNTER — Ambulatory Visit (INDEPENDENT_AMBULATORY_CARE_PROVIDER_SITE_OTHER): Admitting: Gynecology

## 2014-03-09 VITALS — BP 140/86 | Ht 65.0 in | Wt 145.0 lb

## 2014-03-09 DIAGNOSIS — Z01419 Encounter for gynecological examination (general) (routine) without abnormal findings: Secondary | ICD-10-CM

## 2014-03-09 DIAGNOSIS — Z79890 Hormone replacement therapy (postmenopausal): Secondary | ICD-10-CM

## 2014-03-09 DIAGNOSIS — M8580 Other specified disorders of bone density and structure, unspecified site: Secondary | ICD-10-CM

## 2014-03-09 DIAGNOSIS — M949 Disorder of cartilage, unspecified: Secondary | ICD-10-CM

## 2014-03-09 DIAGNOSIS — M899 Disorder of bone, unspecified: Secondary | ICD-10-CM

## 2014-03-09 MED ORDER — ESTRADIOL 0.05 MG/24HR TD PTTW: 1.0000 | MEDICATED_PATCH | TRANSDERMAL | Status: DC

## 2014-03-09 MED ORDER — PROGESTERONE MICRONIZED 100 MG PO CAPS: 100.0000 mg | ORAL_CAPSULE | Freq: Every day | ORAL | Status: DC

## 2014-03-09 NOTE — Progress Notes (Signed)
Joanne Prince 1956/01/26 [id number]        58 y.o.  G2P2 for annual exam.  Several issues noted below.  Past medical history,surgical history, problem list, medications, allergies, family history and social history were all reviewed and documented as reviewed in the EPIC chart.  ROS:  12 system ROS performed with pertinent positives and negatives included in the history, assessment and plan.   Additional significant findings :  None   Exam: Kim Counsellor Vitals:   03/09/14 1016  BP: 140/86  Height: 5\' 5"  (1.651 m)  Weight: 145 lb (65.772 kg)   General appearance:  Normal affect, orientation and appearance. Skin: Grossly normal HEENT: Without gross lesions.  No cervical or supraclavicular adenopathy. Thyroid normal.  Lungs:  Clear without wheezing, rales or rhonchi Cardiac: RR, without RMG Abdominal:  Soft, nontender, without masses, guarding, rebound, organomegaly or hernia Breasts:  Examined lying and sitting without masses, retractions, discharge or axillary adenopathy. Pelvic:  Ext/BUS/vagina normal  Cervix normal. Pap done  Uterus anteverted, normal size, shape and contour, midline and mobile nontender   Adnexa  Without masses or tenderness    Anus and perineum  Normal   Rectovaginal  Normal sphincter tone without palpated masses or tenderness.    Assessment/Plan:  58 y.o. G2P2 female for annual exam.  1. Postmenopausal/HRT.  Patient continues on Vivelle 0.05 mg patch and Prometrium 100 mg nightly. Initiated for menopausal symptoms. Doing well and wants to continue. No vaginal bleeding. I again reviewed the whole issue of HRT with her to include the WHI study with increased risk of stroke, heart attack, DVT and breast cancer. The ACOG and NAMS statements for lowest dose for the shortest period of time reviewed. Transdermal versus oral first-pass effect benefit discussed.  Patient understands and accepts I refilled her prescriptions x1 year 2. Osteopenia. DEXA 04/2013 T  score -1.6. FRAX 6.8%/0.6%. Supplementing vitamin D. Plan repeat DEXA next year at 2 year interval. 3. Pap smear/HPV 2014. Pap done today. History of ascus negative high-risk HPV 2013. I did one today to have 2 normals in a row. Assuming normal then plan less frequent screening interval at 3-5 year per current screening guidelines. 4. Mammography 2014. Due next month and I reminded her to schedule this. SBE monthly reviewed. 5. Colonoscopy 2006 with planned repeat next year. 6. History of positive HSV antibodies. Remains without any history of outbreaks. Will continue to monitor. Report any loss of outbreaks 7. Health maintenance. Blood pressure 140/86. Patient reports history of white coat hypertension. She monitors her blood pressure at home and reports it to be normal. No blood work done today as it is done through Dr. Gwynn Burly office. Followup one year, sooner as needed   Note: This document was prepared with digital dictation and possible smart phrase technology. Any transcriptional errors that result from this process are unintentional.   Anastasio Auerbach MD, 10:39 AM 03/09/2014

## 2014-03-09 NOTE — Addendum Note (Signed)
Addended by: Nelva Nay on: 03/09/2014 11:04 AM   Modules accepted: Orders

## 2014-03-09 NOTE — Patient Instructions (Signed)
Continue on hormone replacement as we discussed. Call me if you do any bleeding. Followup for your mammogram in August Followup with me in one year or annual exam, sooner if any issues.  You may obtain a copy of any labs that were done today by logging onto MyChart as outlined in the instructions provided with your AVS (after visit summary). The office will not call with normal lab results but certainly if there are any significant abnormalities then we will contact you.   Health Maintenance, Female A healthy lifestyle and preventative care can promote health and wellness.  Maintain regular health, dental, and eye exams.  Eat a healthy diet. Foods like vegetables, fruits, whole grains, low-fat dairy products, and lean protein foods contain the nutrients you need without too many calories. Decrease your intake of foods high in solid fats, added sugars, and salt. Get information about a proper diet from your caregiver, if necessary.  Regular physical exercise is one of the most important things you can do for your health. Most adults should get at least 150 minutes of moderate-intensity exercise (any activity that increases your heart rate and causes you to sweat) each week. In addition, most adults need muscle-strengthening exercises on 2 or more days a week.   Maintain a healthy weight. The body mass index (BMI) is a screening tool to identify possible weight problems. It provides an estimate of body fat based on height and weight. Your caregiver can help determine your BMI, and can help you achieve or maintain a healthy weight. For adults 20 years and older:  A BMI below 18.5 is considered underweight.  A BMI of 18.5 to 24.9 is normal.  A BMI of 25 to 29.9 is considered overweight.  A BMI of 30 and above is considered obese.  Maintain normal blood lipids and cholesterol by exercising and minimizing your intake of saturated fat. Eat a balanced diet with plenty of fruits and vegetables.  Blood tests for lipids and cholesterol should begin at age 69 and be repeated every 5 years. If your lipid or cholesterol levels are high, you are over 50, or you are a high risk for heart disease, you may need your cholesterol levels checked more frequently.Ongoing high lipid and cholesterol levels should be treated with medicines if diet and exercise are not effective.  If you smoke, find out from your caregiver how to quit. If you do not use tobacco, do not start.  Lung cancer screening is recommended for adults aged 16 80 years who are at high risk for developing lung cancer because of a history of smoking. Yearly low-dose computed tomography (CT) is recommended for people who have at least a 30-pack-year history of smoking and are a current smoker or have quit within the past 15 years. A pack year of smoking is smoking an average of 1 pack of cigarettes a day for 1 year (for example: 1 pack a day for 30 years or 2 packs a day for 15 years). Yearly screening should continue until the smoker has stopped smoking for at least 15 years. Yearly screening should also be stopped for people who develop a health problem that would prevent them from having lung cancer treatment.  If you are pregnant, do not drink alcohol. If you are breastfeeding, be very cautious about drinking alcohol. If you are not pregnant and choose to drink alcohol, do not exceed 1 drink per day. One drink is considered to be 12 ounces (355 mL) of beer, 5 ounces (  148 mL) of wine, or 1.5 ounces (44 mL) of liquor.  Avoid use of street drugs. Do not share needles with anyone. Ask for help if you need support or instructions about stopping the use of drugs.  High blood pressure causes heart disease and increases the risk of stroke. Blood pressure should be checked at least every 1 to 2 years. Ongoing high blood pressure should be treated with medicines, if weight loss and exercise are not effective.  If you are 50 to 57 years old, ask your  caregiver if you should take aspirin to prevent strokes.  Diabetes screening involves taking a blood sample to check your fasting blood sugar level. This should be done once every 3 years, after age 38, if you are within normal weight and without risk factors for diabetes. Testing should be considered at a younger age or be carried out more frequently if you are overweight and have at least 1 risk factor for diabetes.  Breast cancer screening is essential preventative care for women. You should practice "breast self-awareness." This means understanding the normal appearance and feel of your breasts and may include breast self-examination. Any changes detected, no matter how small, should be reported to a caregiver. Women in their 21s and 30s should have a clinical breast exam (CBE) by a caregiver as part of a regular health exam every 1 to 3 years. After age 52, women should have a CBE every year. Starting at age 52, women should consider having a mammogram (breast X-ray) every year. Women who have a family history of breast cancer should talk to their caregiver about genetic screening. Women at a high risk of breast cancer should talk to their caregiver about having an MRI and a mammogram every year.  Breast cancer gene (BRCA)-related cancer risk assessment is recommended for women who have family members with BRCA-related cancers. BRCA-related cancers include breast, ovarian, tubal, and peritoneal cancers. Having family members with these cancers may be associated with an increased risk for harmful changes (mutations) in the breast cancer genes BRCA1 and BRCA2. Results of the assessment will determine the need for genetic counseling and BRCA1 and BRCA2 testing.  The Pap test is a screening test for cervical cancer. Women should have a Pap test starting at age 67. Between ages 43 and 92, Pap tests should be repeated every 2 years. Beginning at age 47, you should have a Pap test every 3 years as long as the  past 3 Pap tests have been normal. If you had a hysterectomy for a problem that was not cancer or a condition that could lead to cancer, then you no longer need Pap tests. If you are between ages 43 and 2, and you have had normal Pap tests going back 10 years, you no longer need Pap tests. If you have had past treatment for cervical cancer or a condition that could lead to cancer, you need Pap tests and screening for cancer for at least 20 years after your treatment. If Pap tests have been discontinued, risk factors (such as a new sexual partner) need to be reassessed to determine if screening should be resumed. Some women have medical problems that increase the chance of getting cervical cancer. In these cases, your caregiver may recommend more frequent screening and Pap tests.  The human papillomavirus (HPV) test is an additional test that may be used for cervical cancer screening. The HPV test looks for the virus that can cause the cell changes on the cervix. The  cells collected during the Pap test can be tested for HPV. The HPV test could be used to screen women aged 39 years and older, and should be used in women of any age who have unclear Pap test results. After the age of 59, women should have HPV testing at the same frequency as a Pap test.  Colorectal cancer can be detected and often prevented. Most routine colorectal cancer screening begins at the age of 94 and continues through age 49. However, your caregiver may recommend screening at an earlier age if you have risk factors for colon cancer. On a yearly basis, your caregiver may provide home test kits to check for hidden blood in the stool. Use of a small camera at the end of a tube, to directly examine the colon (sigmoidoscopy or colonoscopy), can detect the earliest forms of colorectal cancer. Talk to your caregiver about this at age 55, when routine screening begins. Direct examination of the colon should be repeated every 5 to 10 years through  age 28, unless early forms of pre-cancerous polyps or small growths are found.  Hepatitis C blood testing is recommended for all people born from [birth year] through 1965 and any individual with known risks for hepatitis C.  Practice safe sex. Use condoms and avoid high-risk sexual practices to reduce the spread of sexually transmitted infections (STIs). Sexually active women aged 72 and younger should be checked for Chlamydia, which is a common sexually transmitted infection. Older women with new or multiple partners should also be tested for Chlamydia. Testing for other STIs is recommended if you are sexually active and at increased risk.  Osteoporosis is a disease in which the bones lose minerals and strength with aging. This can result in serious bone fractures. The risk of osteoporosis can be identified using a bone density scan. Women ages 70 and over and women at risk for fractures or osteoporosis should discuss screening with their caregivers. Ask your caregiver whether you should be taking a calcium supplement or vitamin D to reduce the rate of osteoporosis.  Menopause can be associated with physical symptoms and risks. Hormone replacement therapy is available to decrease symptoms and risks. You should talk to your caregiver about whether hormone replacement therapy is right for you.  Use sunscreen. Apply sunscreen liberally and repeatedly throughout the day. You should seek shade when your shadow is shorter than you. Protect yourself by wearing long sleeves, pants, a wide-brimmed hat, and sunglasses year round, whenever you are outdoors.  Notify your caregiver of new moles or changes in moles, especially if there is a change in shape or color. Also notify your caregiver if a mole is larger than the size of a pencil eraser.  Stay current with your immunizations. Document Released: 03/04/2011 Document Revised: 12/14/2012 Document Reviewed: 03/04/2011 [facility] Patient Information 2014 [place].

## 2014-03-10 LAB — CYTOLOGY - PAP

## 2014-03-10 LAB — URINALYSIS W MICROSCOPIC + REFLEX CULTURE
BILIRUBIN URINE: NEGATIVE
Bacteria, UA: NONE SEEN
CASTS: NONE SEEN
Crystals: NONE SEEN
GLUCOSE, UA: NEGATIVE mg/dL
HGB URINE DIPSTICK: NEGATIVE
Ketones, ur: NEGATIVE mg/dL
LEUKOCYTES UA: NEGATIVE
Nitrite: NEGATIVE
PH: 6 (ref 5.0–8.0)
Protein, ur: NEGATIVE mg/dL
SQUAMOUS EPITHELIAL / LPF: NONE SEEN
Specific Gravity, Urine: <1.005 — ABNORMAL LOW (ref 1.005–1.030)
Urobilinogen, UA: 0.2 mg/dL (ref 0.0–1.0)

## 2014-04-06 ENCOUNTER — Encounter: Payer: Self-pay | Admitting: Gynecology

## 2014-06-16 ENCOUNTER — Ambulatory Visit (INDEPENDENT_AMBULATORY_CARE_PROVIDER_SITE_OTHER)

## 2014-06-16 DIAGNOSIS — Z23 Encounter for immunization: Secondary | ICD-10-CM

## 2014-07-04 ENCOUNTER — Encounter: Payer: Self-pay | Admitting: Gynecology

## 2014-07-07 ENCOUNTER — Encounter: Payer: Self-pay | Admitting: Family Medicine

## 2014-07-07 ENCOUNTER — Ambulatory Visit (INDEPENDENT_AMBULATORY_CARE_PROVIDER_SITE_OTHER): Admitting: Family Medicine

## 2014-07-07 VITALS — BP 126/80 | Temp 98.0°F | Wt 153.0 lb

## 2014-07-07 DIAGNOSIS — J029 Acute pharyngitis, unspecified: Secondary | ICD-10-CM | POA: Diagnosis not present

## 2014-07-07 LAB — POCT RAPID STREP A (OFFICE): Rapid Strep A Screen: NEGATIVE

## 2014-07-07 NOTE — Progress Notes (Signed)
  Garret Reddish, MD Phone: [phone]  Subjective:   Joanne Prince is a 58 y.o. year old very pleasant female patient who presenting with a sore throat for 4 days.Had odd taste in her mouth on Sunday and seemed to persist if not worsen and then worsened. Back of throat sore and then the left side primarily.   Associated symptoms include:  sneezing.  Symptoms are constant.symptoms stable since yesterday.  Home treatment thus far includes:  rest, NSAIDS/acetaminophen and salt water garles.  Known sick contacts with similar symptoms. 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   ROS-no fever chills. No fell breath.  Past Medical History- HLD, allergic rhinitis-allegra, mitral valve prolapse  Medications- reviewed and updated Current Outpatient Prescriptions  Medication Sig Dispense Refill  . Biotin 5000 MCG CAPS Take by mouth.    . cholecalciferol (VITAMIN D) 1000 UNITS tablet Take 1,000 Units by mouth daily.    Marland Kitchen estradiol (VIVELLE-DOT) 0.05 MG/24HR patch Place 1 patch (0.05 mg total) onto the skin once a week. 8 patch 11  . fexofenadine (ALLEGRA) 180 MG tablet Take 1 tablet (180 mg total) by mouth daily. 30 tablet 5  . progesterone (PROMETRIUM) 100 MG capsule Take 1 capsule (100 mg total) by mouth daily. At bedtime 30 capsule 11   No current facility-administered medications for this visit.    Objective: BP 126/80 mmHg  Temp(Src) 98 F (36.7 C) (Oral)  Wt 153 lb (69.4 kg)  LMP 03/08/2005 Gen: NAD, resting comfortably HEENT-tympanic membranes without acute infectious process, erythematous slightly swollen naris, pharynx with no tonsillar enlargement or exudate. No lymphadenopathy CV: 2/6 systolic ejection murmurwith midsystolic click. Regular rate and rhythm.  Lungs: CTAB no crackles, wheeze, rhonchi Abdomen: soft/nontender/nondistended/normal bowel sounds.  Ext: no edema Skin: warm, dry Neuro: grossly normal, moves all extremities  Results for orders  placed or performed in visit on 07/07/14 (from the past 24 hour(s))  POC Rapid Strep A     Status: Normal   Collection Time: 07/07/14  1:56 PM  Result Value Ref Range   Rapid Strep A Screen Negative Negative    Assessment/Plan:  Viral pharyngitis Centor criteria 0. Rapid strep negative. Patient very fearful with her history of mitral valve prolapse and asks for [place] to send confirmatory testing. continue symptomatically treatment at home. Follow-up if symptoms persist past 10 days with Dr. Jenny Reichmann. No problem-specific assessment & plan notes found for this encounter.  Return precautions advised.   Orders Placed This Encounter  Procedures  . Throat culture [facility])  . POC Rapid Strep A

## 2014-07-07 NOTE — Patient Instructions (Signed)
Continue symptomatic care-hydration, rest, lozenges, honey, salt water gargles, chloraseptic spray, tylenol or ibuprofen for pain. We will call in antibiotics if your follow up test shows strep.   Follow up with Dr. Jenny Reichmann if symptoms persist past 10 days or sooner if worsens or if fever above 100.5

## 2014-07-07 NOTE — Progress Notes (Signed)
Pre visit review using our clinic review tool, if applicable. No additional management support is needed unless otherwise documented below in the visit note. 

## 2014-07-09 LAB — CULTURE, GROUP A STREP: Organism ID, Bacteria: NORMAL

## 2014-07-21 ENCOUNTER — Ambulatory Visit (INDEPENDENT_AMBULATORY_CARE_PROVIDER_SITE_OTHER): Admitting: Family

## 2014-07-21 ENCOUNTER — Encounter: Payer: Self-pay | Admitting: Family

## 2014-07-21 VITALS — BP 154/82 | HR 74 | Temp 98.1°F | Resp 18 | Ht 65.0 in | Wt 151.8 lb

## 2014-07-21 DIAGNOSIS — J019 Acute sinusitis, unspecified: Secondary | ICD-10-CM

## 2014-07-21 MED ORDER — AMOXICILLIN-POT CLAVULANATE 875-125 MG PO TABS: 1.0000 | ORAL_TABLET | Freq: Two times a day (BID) | ORAL | Status: DC

## 2014-07-21 NOTE — Progress Notes (Signed)
   Subjective:    Patient ID: Joanne Prince, female    DOB: Apr 02, 1956, 58 y.o.   MRN: [id number]  Chief Complaint  Patient presents with  . Follow-up    having congestion with yellowish production that hasnt went away    HPI:  Joanne Prince is a 58 y.o. female who presents today    Acute symptoms have been going on for approximately 8 days. Has had a sore throat and was seen at another office where a strep test was negative. Instructed at that time that it was viral and to treat symptomatically. Has not really improved since that time. Newly experiencing some dental pain and has not had any cough or sore throat.  No sinus pressure or headaches, fevers, chills, night sweats. Has tried OTC allergy medicine, saline spray and cold medicines. Denies recent antibiotic use.   No Known Allergies  Current Outpatient Prescriptions on File Prior to Visit  Medication Sig Dispense Refill  . Biotin 5000 MCG CAPS Take by mouth.    . cholecalciferol (VITAMIN D) 1000 UNITS tablet Take 1,000 Units by mouth daily.    Marland Kitchen estradiol (VIVELLE-DOT) 0.05 MG/24HR patch Place 1 patch (0.05 mg total) onto the skin once a week. 8 patch 11  . fexofenadine (ALLEGRA) 180 MG tablet Take 1 tablet (180 mg total) by mouth daily. 30 tablet 5  . progesterone (PROMETRIUM) 100 MG capsule Take 1 capsule (100 mg total) by mouth daily. At bedtime 30 capsule 11   No current facility-administered medications on file prior to visit.    Review of Systems    See HPI Objective:    BP 154/82 mmHg  Pulse 74  Temp(Src) 98.1 F (36.7 C) (Oral)  Resp 18  Ht 5\' 5"  (1.651 m)  Wt 151 lb 12.8 oz (68.856 kg)  BMI 25.26 kg/m2  SpO2 98%  LMP 03/08/2005 Nursing note and vital signs reviewed.  Physical Exam  Constitutional: She is oriented to person, place, and time. She appears well-developed and well-nourished. No distress.  HENT:  Right Ear: Hearing, tympanic membrane, external ear and ear canal normal.  Left Ear: Hearing,  tympanic membrane, external ear and ear canal normal.  Nose: Nose normal. Right sinus exhibits no maxillary sinus tenderness and no frontal sinus tenderness. Left sinus exhibits no maxillary sinus tenderness and no frontal sinus tenderness.  Mouth/Throat: Uvula is midline, oropharynx is clear and moist and mucous membranes are normal.  Light reflex dispersed in right ear slightly.   Cardiovascular: Normal rate, regular rhythm, normal heart sounds and intact distal pulses.   Pulmonary/Chest: Effort normal and breath sounds normal.  Lymphadenopathy:    She has no cervical adenopathy.  Neurological: She is alert and oriented to person, place, and time.  Skin: Skin is warm and dry.  Psychiatric: She has a normal mood and affect. Her behavior is normal. Judgment and thought content normal.       Assessment & Plan:

## 2014-07-21 NOTE — Progress Notes (Signed)
Pre visit review using our clinic review tool, if applicable. No additional management support is needed unless otherwise documented below in the visit note. 

## 2014-07-21 NOTE — Patient Instructions (Signed)
Thank you for choosing [facility].  Summary/Instructions:  Your prescription(s) have been submitted to your pharmacy. Please take as directed and contact our office if you believe you are having problem(s) with the medication(s).  If your symptoms worsen or fail to improve, please contact our office for further instruction, or in case of emergency go directly to the emergency room at the closest medical facility.   General Recommendations: Please drink plenty of fluids. Sleep in humidified air if possible. Use saline nasal sprays or the Netti pot as needed.   Medicaitons:  Flonase as needed to assist with congestion. You may use Mucinex to help break up congestion as you feel is needed. Tylenol as needed for fever. Sudafed for congestion.     Sinusitis Sinusitis is redness, soreness, and inflammation of the paranasal sinuses. Paranasal sinuses are air pockets within the bones of your face (beneath the eyes, the middle of the forehead, or above the eyes). In healthy paranasal sinuses, mucus is able to drain out, and air is able to circulate through them by way of your nose. However, when your paranasal sinuses are inflamed, mucus and air can become trapped. This can allow bacteria and other germs to grow and cause infection. Sinusitis can develop quickly and last only a short time (acute) or continue over a long period (chronic). Sinusitis that lasts for more than 12 weeks is considered chronic.  CAUSES  Causes of sinusitis include:  Allergies.  Structural abnormalities, such as displacement of the cartilage that separates your nostrils (deviated septum), which can decrease the air flow through your nose and sinuses and affect sinus drainage.  Functional abnormalities, such as when the small hairs (cilia) that line your sinuses and help remove mucus do not work properly or are not present. SIGNS AND SYMPTOMS  Symptoms of acute and chronic sinusitis are the same. The primary symptoms  are pain and pressure around the affected sinuses. Other symptoms include:  Upper toothache.  Earache.  Headache.  Bad breath.  Decreased sense of smell and taste.  A cough, which worsens when you are lying flat.  Fatigue.  Fever.  Thick drainage from your nose, which often is green and may contain pus (purulent).  Swelling and warmth over the affected sinuses. DIAGNOSIS  Your health care provider will perform a physical exam. During the exam, your health care provider may:  Look in your nose for signs of abnormal growths in your nostrils (nasal polyps).  Tap over the affected sinus to check for signs of infection.  View the inside of your sinuses (endoscopy) using an imaging device that has a light attached (endoscope). If your health care provider suspects that you have chronic sinusitis, one or more of the following tests may be recommended:  Allergy tests.  Nasal culture. A sample of mucus is taken from your nose, sent to a lab, and screened for bacteria.  Nasal cytology. A sample of mucus is taken from your nose and examined by your health care provider to determine if your sinusitis is related to an allergy. TREATMENT  Most cases of acute sinusitis are related to a viral infection and will resolve on their own within 10 days. Sometimes medicines are prescribed to help relieve symptoms (pain medicine, decongestants, nasal steroid sprays, or saline sprays).  However, for sinusitis related to a bacterial infection, your health care provider will prescribe antibiotic medicines. These are medicines that will help kill the bacteria causing the infection.  Rarely, sinusitis is caused by a fungal   infection. In theses cases, your health care provider will prescribe antifungal medicine. For some cases of chronic sinusitis, surgery is needed. Generally, these are cases in which sinusitis recurs more than 3 times per year, despite other treatments. HOME CARE INSTRUCTIONS   Drink  plenty of water. Water helps thin the mucus so your sinuses can drain more easily.  Use a humidifier.  Inhale steam 3 to 4 times a day (for example, sit in the bathroom with the shower running).  Apply a warm, moist washcloth to your face 3 to 4 times a day, or as directed by your health care provider.  Use saline nasal sprays to help moisten and clean your sinuses.  Take medicines only as directed by your health care provider.  If you were prescribed either an antibiotic or antifungal medicine, finish it all even if you start to feel better. SEEK IMMEDIATE MEDICAL CARE IF:  You have increasing pain or severe headaches.  You have nausea, vomiting, or drowsiness.  You have swelling around your face.  You have vision problems.  You have a stiff neck.  You have difficulty breathing. MAKE SURE YOU:   Understand these instructions.  Will watch your condition.  Will get help right away if you are not doing well or get worse. Document Released: 08/19/2005 Document Revised: 01/03/2014 Document Reviewed: 09/03/2011 [employer] Patient Information 2015 [employer]. This information is not intended to replace advice given to you by your health care provider. Make sure you discuss any questions you have with your health care provider.  

## 2014-07-21 NOTE — Assessment & Plan Note (Signed)
Symptoms and exam consistent with sinusitis. Start Augmentin and continue OTC medications as needed for symptom relief. Follow up if symptoms worsen or fail to improve.

## 2014-09-29 ENCOUNTER — Encounter: Payer: Self-pay | Admitting: Gastroenterology

## 2014-10-06 ENCOUNTER — Encounter: Payer: Self-pay | Admitting: Gastroenterology

## 2014-11-22 ENCOUNTER — Ambulatory Visit (AMBULATORY_SURGERY_CENTER): Payer: Self-pay

## 2014-11-22 VITALS — Ht 65.0 in | Wt 153.0 lb

## 2014-11-22 DIAGNOSIS — Z1211 Encounter for screening for malignant neoplasm of colon: Secondary | ICD-10-CM

## 2014-11-22 MED ORDER — MOVIPREP 100 G PO SOLR: ORAL | Status: DC

## 2014-11-22 NOTE — Progress Notes (Signed)
Per pt, no allergies to soy or egg products.Pt not taking any weight loss meds or using  O2 at home. 

## 2014-12-06 ENCOUNTER — Encounter: Payer: Self-pay | Admitting: Gastroenterology

## 2014-12-06 ENCOUNTER — Ambulatory Visit (AMBULATORY_SURGERY_CENTER): Admitting: Gastroenterology

## 2014-12-06 ENCOUNTER — Encounter: Admitting: Gastroenterology

## 2014-12-06 VITALS — BP 118/70 | HR 65 | Temp 97.8°F | Resp 17 | Ht 65.0 in | Wt 153.0 lb

## 2014-12-06 DIAGNOSIS — D122 Benign neoplasm of ascending colon: Secondary | ICD-10-CM

## 2014-12-06 DIAGNOSIS — K635 Polyp of colon: Secondary | ICD-10-CM | POA: Diagnosis not present

## 2014-12-06 DIAGNOSIS — Z1211 Encounter for screening for malignant neoplasm of colon: Secondary | ICD-10-CM

## 2014-12-06 MED ORDER — SODIUM CHLORIDE 0.9 % IV SOLN: 500.0000 mL | INTRAVENOUS | Status: DC

## 2014-12-06 NOTE — Op Note (Signed)
[facility]  [address] [place], [address]   COLONOSCOPY PROCEDURE REPORT  PATIENT: Joanne Prince, Wigal  MR#: [id number] BIRTHDATE: [birth year] , 55  yrs. old GENDER: female ENDOSCOPIST: Ladene Artist, MD, [facility] PROCEDURE DATE:  12/06/2014 PROCEDURE:   Colonoscopy, screening and Colonoscopy with biopsy First Screening Colonoscopy - Avg.  risk and is 50 yrs.  old or older - No.  Prior Negative Screening - Now for repeat screening. 10 or more years since last screening  History of Adenoma - Now for follow-up colonoscopy & has been > or = to 3 yrs.  N/A ASA CLASS:   Class II INDICATIONS:Screening for colonic neoplasia and Colorectal Neoplasm Risk Assessment for this procedure is average risk. MEDICATIONS: Monitored anesthesia care, Propofol 200 mg IV, and lidocaine 40 mg IV DESCRIPTION OF PROCEDURE:   After the risks benefits and alternatives of the procedure were thoroughly explained, informed consent was obtained.  The digital rectal exam revealed no abnormalities of the rectum.   The LB [id number]  endoscope was introduced through the anus and advanced to the cecum, which was identified by both the appendix and ileocecal valve. No adverse events experienced.   The quality of the prep was good.  (MoviPrep was used)  The instrument was then slowly withdrawn as the colon was fully examined.    COLON FINDINGS: A sessile polyp measuring 5 mm in size was found in the ascending colon.  A polypectomy was performed with cold forceps.  The resection was complete, the polyp tissue was completely retrieved and sent to histology.   The examination was otherwise normal.  Retroflexed views revealed internal Grade I hemorrhoids. The time to cecum = 1.7 Withdrawal time = 10.1   The scope was withdrawn and the procedure completed. COMPLICATIONS: There were no immediate complications.  ENDOSCOPIC IMPRESSION: 1.   Sessile polyp in the ascending colon; polypectomy  performed with cold forceps 2.   Grade l internal hemorrhoids  RECOMMENDATIONS: 1.  Await pathology results 2.  Repeat colonoscopy in 5 years if polyp adenomatous; otherwise 10 years  eSigned:  Ladene Artist, MD, [facility] [place] 12/06/2014 10:49 AM

## 2014-12-06 NOTE — Patient Instructions (Signed)
YOU HAD AN ENDOSCOPIC PROCEDURE TODAY AT THE [facility]:   Refer to the procedure report that was given to you for any specific questions about what was found during the examination.  If the procedure report does not answer your questions, please call your gastroenterologist to clarify.  If you requested that your care partner not be given the details of your procedure findings, then the procedure report has been included in a sealed envelope for you to review at your convenience later.  YOU SHOULD EXPECT: Some feelings of bloating in the abdomen. Passage of more gas than usual.  Walking can help get rid of the air that was put into your GI tract during the procedure and reduce the bloating. If you had a lower endoscopy (such as a colonoscopy or flexible sigmoidoscopy) you may notice spotting of blood in your stool or on the toilet paper. If you underwent a bowel prep for your procedure, you may not have a normal bowel movement for a few days.  Please Note:  You might notice some irritation and congestion in your nose or some drainage.  This is from the oxygen used during your procedure.  There is no need for concern and it should clear up in a day or so.  SYMPTOMS TO REPORT IMMEDIATELY:   Following lower endoscopy (colonoscopy or flexible sigmoidoscopy):  Excessive amounts of blood in the stool  Significant tenderness or worsening of abdominal pains  Swelling of the abdomen that is new, acute  Fever of 100F or higher   For urgent or emergent issues, a gastroenterologist can be reached at any hour by calling [phone].   DIET: Your first meal following the procedure should be a small meal and then it is ok to progress to your normal diet. Heavy or fried foods are harder to digest and may make you feel nauseous or bloated.  Likewise, meals heavy in dairy and vegetables can increase bloating.  Drink plenty of fluids but you should avoid alcoholic beverages for 24  hours.  ACTIVITY:  You should plan to take it easy for the rest of today and you should NOT DRIVE or use heavy machinery until tomorrow (because of the sedation medicines used during the test).    FOLLOW UP: Our staff will call the number listed on your records the next business day following your procedure to check on you and address any questions or concerns that you may have regarding the information given to you following your procedure. If we do not reach you, we will leave a message.  However, if you are feeling well and you are not experiencing any problems, there is no need to return our call.  We will assume that you have returned to your regular daily activities without incident.  If any biopsies were taken you will be contacted by phone or by letter within the next 1-3 weeks.  Please call us at [phone] if you have not heard about the biopsies in 3 weeks.    SIGNATURES/CONFIDENTIALITY: You and/or your care partner have signed paperwork which will be entered into your electronic medical record.  These signatures attest to the fact that that the information above on your After Visit Summary has been reviewed and is understood.  Full responsibility of the confidentiality of this discharge information lies with you and/or your care-partner.  Read all handouts given to you by your recovery room nurse. 

## 2014-12-06 NOTE — Progress Notes (Signed)
Stable to RR 

## 2014-12-06 NOTE — Progress Notes (Signed)
Called to room to assist during endoscopic procedure.  Patient ID and intended procedure confirmed with present staff. Received instructions for my participation in the procedure from the performing physician.  

## 2014-12-07 ENCOUNTER — Telehealth: Payer: Self-pay | Admitting: *Deleted

## 2014-12-07 NOTE — Telephone Encounter (Signed)
  Follow up Call-  Call back number 12/06/2014  Post procedure Call Back phone  # [phone]  Permission to leave phone message Yes     Patient questions:  Do you have a fever, pain , or abdominal swelling? No. Pain Score  0 *  Have you tolerated food without any problems? Yes.    Have you been able to return to your normal activities? Yes.    Do you have any questions about your discharge instructions: Diet   No. Medications  No. Follow up visit  No.  Do you have questions or concerns about your Care? No.  Actions: * If pain score is 4 or above: No action needed, pain <4.

## 2014-12-08 ENCOUNTER — Encounter: Admitting: Gastroenterology

## 2014-12-13 ENCOUNTER — Encounter: Payer: Self-pay | Admitting: Gastroenterology

## 2014-12-14 NOTE — Telephone Encounter (Signed)
error 

## 2015-03-08 ENCOUNTER — Other Ambulatory Visit: Payer: Self-pay | Admitting: Gynecology

## 2015-03-17 ENCOUNTER — Encounter: Admitting: Gynecology

## 2015-03-27 ENCOUNTER — Ambulatory Visit (INDEPENDENT_AMBULATORY_CARE_PROVIDER_SITE_OTHER): Admitting: Gynecology

## 2015-03-27 ENCOUNTER — Encounter: Payer: Self-pay | Admitting: Gynecology

## 2015-03-27 VITALS — BP 134/84 | Ht 65.0 in | Wt 147.0 lb

## 2015-03-27 DIAGNOSIS — Z01419 Encounter for gynecological examination (general) (routine) without abnormal findings: Secondary | ICD-10-CM | POA: Diagnosis not present

## 2015-03-27 DIAGNOSIS — M8580 Other specified disorders of bone density and structure, unspecified site: Secondary | ICD-10-CM | POA: Diagnosis not present

## 2015-03-27 DIAGNOSIS — Z79890 Hormone replacement therapy (postmenopausal): Secondary | ICD-10-CM | POA: Diagnosis not present

## 2015-03-27 MED ORDER — PROGESTERONE MICRONIZED 100 MG PO CAPS: ORAL_CAPSULE | ORAL | Status: DC

## 2015-03-27 MED ORDER — ESTRADIOL 0.05 MG/24HR TD PTTW: 1.0000 | MEDICATED_PATCH | TRANSDERMAL | Status: DC

## 2015-03-27 NOTE — Patient Instructions (Signed)
Follow up for bone density as scheduled.  You may obtain a copy of any labs that were done today by logging onto MyChart as outlined in the instructions provided with your AVS (after visit summary). The office will not call with normal lab results but certainly if there are any significant abnormalities then we will contact you.   Health Maintenance, Female A healthy lifestyle and preventative care can promote health and wellness.  Maintain regular health, dental, and eye exams.  Eat a healthy diet. Foods like vegetables, fruits, whole grains, low-fat dairy products, and lean protein foods contain the nutrients you need without too many calories. Decrease your intake of foods high in solid fats, added sugars, and salt. Get information about a proper diet from your caregiver, if necessary.  Regular physical exercise is one of the most important things you can do for your health. Most adults should get at least 150 minutes of moderate-intensity exercise (any activity that increases your heart rate and causes you to sweat) each week. In addition, most adults need muscle-strengthening exercises on 2 or more days a week.   Maintain a healthy weight. The body mass index (BMI) is a screening tool to identify possible weight problems. It provides an estimate of body fat based on height and weight. Your caregiver can help determine your BMI, and can help you achieve or maintain a healthy weight. For adults 20 years and older:  A BMI below 18.5 is considered underweight.  A BMI of 18.5 to 24.9 is normal.  A BMI of 25 to 29.9 is considered overweight.  A BMI of 30 and above is considered obese.  Maintain normal blood lipids and cholesterol by exercising and minimizing your intake of saturated fat. Eat a balanced diet with plenty of fruits and vegetables. Blood tests for lipids and cholesterol should begin at age 20 and be repeated every 5 years. If your lipid or cholesterol levels are high, you are over  50, or you are a high risk for heart disease, you may need your cholesterol levels checked more frequently.Ongoing high lipid and cholesterol levels should be treated with medicines if diet and exercise are not effective.  If you smoke, find out from your caregiver how to quit. If you do not use tobacco, do not start.  Lung cancer screening is recommended for adults aged 55 80 years who are at high risk for developing lung cancer because of a history of smoking. Yearly low-dose computed tomography (CT) is recommended for people who have at least a 30-pack-year history of smoking and are a current smoker or have quit within the past 15 years. A pack year of smoking is smoking an average of 1 pack of cigarettes a day for 1 year (for example: 1 pack a day for 30 years or 2 packs a day for 15 years). Yearly screening should continue until the smoker has stopped smoking for at least 15 years. Yearly screening should also be stopped for people who develop a health problem that would prevent them from having lung cancer treatment.  If you are pregnant, do not drink alcohol. If you are breastfeeding, be very cautious about drinking alcohol. If you are not pregnant and choose to drink alcohol, do not exceed 1 drink per day. One drink is considered to be 12 ounces (355 mL) of beer, 5 ounces (148 mL) of wine, or 1.5 ounces (44 mL) of liquor.  Avoid use of street drugs. Do not share needles with anyone. Ask for help   if you need support or instructions about stopping the use of drugs.  High blood pressure causes heart disease and increases the risk of stroke. Blood pressure should be checked at least every 1 to 2 years. Ongoing high blood pressure should be treated with medicines, if weight loss and exercise are not effective.  If you are 21 to 58 years old, ask your caregiver if you should take aspirin to prevent strokes.  Diabetes screening involves taking a blood sample to check your fasting blood sugar level.  This should be done once every 3 years, after age 26, if you are within normal weight and without risk factors for diabetes. Testing should be considered at a younger age or be carried out more frequently if you are overweight and have at least 1 risk factor for diabetes.  Breast cancer screening is essential preventative care for women. You should practice "breast self-awareness." This means understanding the normal appearance and feel of your breasts and may include breast self-examination. Any changes detected, no matter how small, should be reported to a caregiver. Women in their 49s and 30s should have a clinical breast exam (CBE) by a caregiver as part of a regular health exam every 1 to 3 years. After age 41, women should have a CBE every year. Starting at age 26, women should consider having a mammogram (breast X-ray) every year. Women who have a family history of breast cancer should talk to their caregiver about genetic screening. Women at a high risk of breast cancer should talk to their caregiver about having an MRI and a mammogram every year.  Breast cancer gene (BRCA)-related cancer risk assessment is recommended for women who have family members with BRCA-related cancers. BRCA-related cancers include breast, ovarian, tubal, and peritoneal cancers. Having family members with these cancers may be associated with an increased risk for harmful changes (mutations) in the breast cancer genes BRCA1 and BRCA2. Results of the assessment will determine the need for genetic counseling and BRCA1 and BRCA2 testing.  The Pap test is a screening test for cervical cancer. Women should have a Pap test starting at age 45. Between ages 66 and 15, Pap tests should be repeated every 2 years. Beginning at age 65, you should have a Pap test every 3 years as long as the past 3 Pap tests have been normal. If you had a hysterectomy for a problem that was not cancer or a condition that could lead to cancer, then you no  longer need Pap tests. If you are between ages 48 and 25, and you have had normal Pap tests going back 10 years, you no longer need Pap tests. If you have had past treatment for cervical cancer or a condition that could lead to cancer, you need Pap tests and screening for cancer for at least 20 years after your treatment. If Pap tests have been discontinued, risk factors (such as a new sexual partner) need to be reassessed to determine if screening should be resumed. Some women have medical problems that increase the chance of getting cervical cancer. In these cases, your caregiver may recommend more frequent screening and Pap tests.  The human papillomavirus (HPV) test is an additional test that may be used for cervical cancer screening. The HPV test looks for the virus that can cause the cell changes on the cervix. The cells collected during the Pap test can be tested for HPV. The HPV test could be used to screen women aged 75 years and older, and  should be used in women of any age who have unclear Pap test results. After the age of 30, women should have HPV testing at the same frequency as a Pap test.  Colorectal cancer can be detected and often prevented. Most routine colorectal cancer screening begins at the age of 50 and continues through age 75. However, your caregiver may recommend screening at an earlier age if you have risk factors for colon cancer. On a yearly basis, your caregiver may provide home test kits to check for hidden blood in the stool. Use of a small camera at the end of a tube, to directly examine the colon (sigmoidoscopy or colonoscopy), can detect the earliest forms of colorectal cancer. Talk to your caregiver about this at age 50, when routine screening begins. Direct examination of the colon should be repeated every 5 to 10 years through age 75, unless early forms of pre-cancerous polyps or small growths are found.  Hepatitis C blood testing is recommended for all people born from  1945 through 1965 and any individual with known risks for hepatitis C.  Practice safe sex. Use condoms and avoid high-risk sexual practices to reduce the spread of sexually transmitted infections (STIs). Sexually active women aged 25 and younger should be checked for Chlamydia, which is a common sexually transmitted infection. Older women with new or multiple partners should also be tested for Chlamydia. Testing for other STIs is recommended if you are sexually active and at increased risk.  Osteoporosis is a disease in which the bones lose minerals and strength with aging. This can result in serious bone fractures. The risk of osteoporosis can be identified using a bone density scan. Women ages 65 and over and women at risk for fractures or osteoporosis should discuss screening with their caregivers. Ask your caregiver whether you should be taking a calcium supplement or vitamin D to reduce the rate of osteoporosis.  Menopause can be associated with physical symptoms and risks. Hormone replacement therapy is available to decrease symptoms and risks. You should talk to your caregiver about whether hormone replacement therapy is right for you.  Use sunscreen. Apply sunscreen liberally and repeatedly throughout the day. You should seek shade when your shadow is shorter than you. Protect yourself by wearing long sleeves, pants, a wide-brimmed hat, and sunglasses year round, whenever you are outdoors.  Notify your caregiver of new moles or changes in moles, especially if there is a change in shape or color. Also notify your caregiver if a mole is larger than the size of a pencil eraser.  Stay current with your immunizations. Document Released: 03/04/2011 Document Revised: 12/14/2012 Document Reviewed: 03/04/2011 [employer] Patient Information 2014 [employer].   

## 2015-03-27 NOTE — Progress Notes (Signed)
TALETHA TWIFORD 1956-05-15 [id number]        59 y.o.  G2P2 for annual exam.  Several issues noted below.  Past medical history,surgical history, problem list, medications, allergies, family history and social history were all reviewed and documented as reviewed in the EPIC chart.  ROS:  Performed with pertinent positives and negatives included in the history, assessment and plan.   Additional significant findings :  none   Exam: Kim Counsellor Vitals:   03/27/15 0800  BP: 134/84  Height: 5\' 5"  (1.651 m)  Weight: 147 lb (66.679 kg)   General appearance:  Normal affect, orientation and appearance. Skin: Grossly normal HEENT: Without gross lesions.  No cervical or supraclavicular adenopathy. Thyroid normal.  Lungs:  Clear without wheezing, rales or rhonchi Cardiac: RR, without RMG Abdominal:  Soft, nontender, without masses, guarding, rebound, organomegaly or hernia Breasts:  Examined lying and sitting without masses, retractions, discharge or axillary adenopathy. Pelvic:  Ext/BUS/vagina normal  Cervix normal  Uterus anteverted, normal size, shape and contour, midline and mobile nontender   Adnexa  Without masses or tenderness    Anus and perineum  Normal   Rectovaginal  Normal sphincter tone without palpated masses or tenderness.    Assessment/Plan:  59 y.o. G2P2 female for annual exam.   1. Postmenopausal/HRT. Patient continues on Vivelle 0.05 mg and Prometrium 100 mg nightly. Doing well and wants to continue. No bleeding. Risks again reviewed to include the WHI study and increased risks of stroke heart attack DVT and breast cancer. Patient notes good relief of her hot flushes using HRT and better concentration. Understands and accepts the risks. Refill 1 year provided. Patient knows to call if any vaginal bleeding. 2. Osteopenia. DEXA 04/2013 T score -1.6 FRAX 6.8%/0.6%. Repeat DEXA now a 2 year interval. Patient will schedule. Increased calcium vitamin D reviewed. 3. Pap  smear 03/2014 normal. No Pap smear done today. History of ASCUS, negative high risk HPV 2013.  Normal Pap smear negative high risk HPV 2014.  Plan repeat Pap smear at 3 year interval. 4. Mammography 04/2014. Scheduled this coming month. Increased calcium vitamin D reviewed. 5. Colonoscopy 2016. Repeat at their recommended interval. 6. Health maintenance. Mild elevated blood pressure 134/84. Patient does check her blood pressure at home and notes that it is normal. No routine blood work done as patient does this through Dr. Gwynn Burly office. Follow up for DEXA otherwise annual exam in one year.   Anastasio Auerbach MD, 8:23 AM 03/27/2015

## 2015-03-28 LAB — URINALYSIS W MICROSCOPIC + REFLEX CULTURE
BILIRUBIN URINE: NEGATIVE
Casts: NONE SEEN
Crystals: NONE SEEN
GLUCOSE, UA: NEGATIVE mg/dL
Hgb urine dipstick: NEGATIVE
Ketones, ur: NEGATIVE mg/dL
Nitrite: NEGATIVE
PH: 6 (ref 5.0–8.0)
Protein, ur: NEGATIVE mg/dL
SPECIFIC GRAVITY, URINE: 1.021 (ref 1.005–1.030)
UROBILINOGEN UA: 1 mg/dL (ref 0.0–1.0)

## 2015-03-31 ENCOUNTER — Other Ambulatory Visit: Payer: Self-pay | Admitting: Gynecology

## 2015-03-31 LAB — URINE CULTURE: Colony Count: >=100000

## 2015-03-31 MED ORDER — SULFAMETHOXAZOLE-TRIMETHOPRIM 800-160 MG PO TABS: 1.0000 | ORAL_TABLET | Freq: Two times a day (BID) | ORAL | Status: DC

## 2015-04-03 DIAGNOSIS — M949 Disorder of cartilage, unspecified: Secondary | ICD-10-CM

## 2015-04-03 DIAGNOSIS — M899 Disorder of bone, unspecified: Secondary | ICD-10-CM

## 2015-04-03 HISTORY — DX: Disorder of cartilage, unspecified: M94.9

## 2015-04-03 HISTORY — DX: Disorder of bone, unspecified: M89.9

## 2015-04-04 ENCOUNTER — Telehealth: Payer: Self-pay | Admitting: Internal Medicine

## 2015-04-04 DIAGNOSIS — L989 Disorder of the skin and subcutaneous tissue, unspecified: Secondary | ICD-10-CM

## 2015-04-04 NOTE — Telephone Encounter (Signed)
referal done 

## 2015-04-04 NOTE — Telephone Encounter (Signed)
Pt called in and said that she has a mole on her back and would like a referral to see a dermatologist

## 2015-04-06 ENCOUNTER — Encounter: Payer: Self-pay | Admitting: Internal Medicine

## 2015-04-06 LAB — HM MAMMOGRAPHY: HM Mammogram: NEGATIVE

## 2015-04-10 ENCOUNTER — Encounter: Payer: Self-pay | Admitting: Internal Medicine

## 2015-04-18 ENCOUNTER — Other Ambulatory Visit: Payer: Self-pay | Admitting: Gynecology

## 2015-04-18 ENCOUNTER — Ambulatory Visit (INDEPENDENT_AMBULATORY_CARE_PROVIDER_SITE_OTHER)

## 2015-04-18 ENCOUNTER — Encounter: Payer: Self-pay | Admitting: Gynecology

## 2015-04-18 DIAGNOSIS — M8580 Other specified disorders of bone density and structure, unspecified site: Secondary | ICD-10-CM

## 2015-04-18 DIAGNOSIS — Z13820 Encounter for screening for osteoporosis: Secondary | ICD-10-CM

## 2016-03-27 ENCOUNTER — Ambulatory Visit (INDEPENDENT_AMBULATORY_CARE_PROVIDER_SITE_OTHER): Admitting: Gynecology

## 2016-03-27 ENCOUNTER — Encounter: Payer: Self-pay | Admitting: Gynecology

## 2016-03-27 VITALS — BP 118/78 | Ht 65.0 in | Wt 145.0 lb

## 2016-03-27 DIAGNOSIS — Z01419 Encounter for gynecological examination (general) (routine) without abnormal findings: Secondary | ICD-10-CM

## 2016-03-27 DIAGNOSIS — Z13220 Encounter for screening for lipoid disorders: Secondary | ICD-10-CM | POA: Diagnosis not present

## 2016-03-27 DIAGNOSIS — M8580 Other specified disorders of bone density and structure, unspecified site: Secondary | ICD-10-CM | POA: Diagnosis not present

## 2016-03-27 DIAGNOSIS — Z1329 Encounter for screening for other suspected endocrine disorder: Secondary | ICD-10-CM | POA: Diagnosis not present

## 2016-03-27 DIAGNOSIS — Z79890 Hormone replacement therapy (postmenopausal): Secondary | ICD-10-CM

## 2016-03-27 LAB — CBC WITH DIFFERENTIAL/PLATELET
BASOS ABS: 52 {cells}/uL (ref 0–200)
Basophils Relative: 1 %
EOS ABS: 52 {cells}/uL (ref 15–500)
EOS PCT: 1 %
HCT: 40.2 % (ref 35.0–45.0)
Hemoglobin: 13.2 g/dL (ref 11.7–15.5)
LYMPHS PCT: 13 %
Lymphs Abs: 676 cells/uL — ABNORMAL LOW (ref 850–3900)
MCH: 29.3 pg (ref 27.0–33.0)
MCHC: 32.8 g/dL (ref 32.0–36.0)
MCV: 89.1 fL (ref 80.0–100.0)
MONOS PCT: 7 %
MPV: 10.2 fL (ref 7.5–12.5)
Monocytes Absolute: 364 cells/uL (ref 200–950)
Neutro Abs: 4056 cells/uL (ref 1500–7800)
Neutrophils Relative %: 78 %
PLATELETS: 195 10*3/uL (ref 140–400)
RBC: 4.51 MIL/uL (ref 3.80–5.10)
RDW: 13.1 % (ref 11.0–15.0)
WBC: 5.2 10*3/uL (ref 3.8–10.8)

## 2016-03-27 LAB — TSH: TSH: 1.75 mIU/L

## 2016-03-27 MED ORDER — ESTRADIOL 0.05 MG/24HR TD PTTW: 1.0000 | MEDICATED_PATCH | TRANSDERMAL | 12 refills | Status: DC

## 2016-03-27 MED ORDER — PROGESTERONE MICRONIZED 100 MG PO CAPS: ORAL_CAPSULE | ORAL | 12 refills | Status: DC

## 2016-03-27 NOTE — Patient Instructions (Signed)

## 2016-03-27 NOTE — Progress Notes (Signed)
    Joanne Prince 1956-08-08 [id number]        60 y.o.  G2P2  for annual exam.  Doing well without complaints  Past medical history,surgical history, problem list, medications, allergies, family history and social history were all reviewed and documented as reviewed in the EPIC chart.  ROS:  Performed with pertinent positives and negatives included in the history, assessment and plan.   Additional significant findings :  None   Exam: Caryn Bee assistant Vitals:   03/27/16 0819  BP: 118/78  Weight: 145 lb (65.8 kg)  Height: 5\' 5"  (1.651 m)   General appearance:  Normal affect, orientation and appearance. Skin: Grossly normal HEENT: Without gross lesions.  No cervical or supraclavicular adenopathy. Thyroid normal.  Lungs:  Clear without wheezing, rales or rhonchi Cardiac: RR, without RMG Abdominal:  Soft, nontender, without masses, guarding, rebound, organomegaly or hernia Breasts:  Examined lying and sitting without masses, retractions, discharge or axillary adenopathy. Pelvic:  Ext/BUS/Vagina normal  Cervix normal  Uterus anteverted, normal size, shape and contour, midline and mobile nontender   Adnexa without masses or tenderness    Anus and perineum normal   Rectovaginal normal sphincter tone without palpated masses or tenderness.    Assessment/Plan:  60 y.o. G2P2 female for annual exam.   1. Postmenopausal/HRT.  Continues on her Vivelle 0.05 mg patch and Prometrium 100 mg at night. No bleeding. Forgot it on vacation and had significant emotional swings. I reviewed the most recent 2017 [employer] HRT guidelines. I discussed possible benefits of early initiation to include cardiovascular and bone health as well as symptom relief possible risks as far as thrombosis and breast cancer. As a shared physician/patient decision we both agree to continue her on this medication and I refilled her 1 year. Patient knows to report any vaginal bleeding. 2. Osteopenia.  DEXA 04/2015 T score -1.7  FRAX 17%/0.7%. Stable from prior DEXA. Check vitamin D level today. Plan repeat DEXA next year at 2 year interval. 3. Pap smear 03/2014. No Pap smear done today. No history of abnormal Pap smears. Plan repeat Pap smear next year at 3 year interval per current screening guidelines. 4. Mammography due next month and patient will schedule. SBE monthly reviewed. 5. Colonoscopy 2016. Repeat at their recommended interval. 6. Health maintenance. Patient asked if I could do routine lab work. CBC, CMP, lipid profile, urinalysis, TSH and vitamin D ordered. Follow up 1 year, sooner as needed.   Anastasio Auerbach MD, 8:43 AM 03/27/2016

## 2016-03-28 LAB — VITAMIN D 25 HYDROXY (VIT D DEFICIENCY, FRACTURES): Vit D, 25-Hydroxy: 46 ng/mL (ref 30–100)

## 2016-03-28 LAB — COMPREHENSIVE METABOLIC PANEL
ALBUMIN: 3.9 g/dL (ref 3.6–5.1)
ALT: 12 U/L (ref 6–29)
AST: 17 U/L (ref 10–35)
Alkaline Phosphatase: 45 U/L (ref 33–130)
BUN: 11 mg/dL (ref 7–25)
CHLORIDE: 101 mmol/L (ref 98–110)
CO2: 26 mmol/L (ref 20–31)
CREATININE: 0.82 mg/dL (ref 0.50–1.05)
Calcium: 8.8 mg/dL (ref 8.6–10.4)
Glucose, Bld: 134 mg/dL — ABNORMAL HIGH (ref 65–99)
POTASSIUM: 4.2 mmol/L (ref 3.5–5.3)
Sodium: 138 mmol/L (ref 135–146)
TOTAL PROTEIN: 6.5 g/dL (ref 6.1–8.1)
Total Bilirubin: 1 mg/dL (ref 0.2–1.2)

## 2016-03-28 LAB — LIPID PANEL
Cholesterol: 146 mg/dL (ref 125–200)
HDL: 52 mg/dL (ref >=46)
LDL Cholesterol: 77 mg/dL (ref <130)
TRIGLYCERIDES: 84 mg/dL (ref <150)
Total CHOL/HDL Ratio: 2.8 Ratio (ref <=5.0)
VLDL: 17 mg/dL (ref <30)

## 2016-03-28 LAB — URINALYSIS W MICROSCOPIC + REFLEX CULTURE
Bilirubin Urine: NEGATIVE
CASTS: NONE SEEN [LPF]
Crystals: NONE SEEN [HPF]
GLUCOSE, UA: NEGATIVE
Ketones, ur: NEGATIVE
NITRITE: POSITIVE — AB
PH: 6 (ref 5.0–8.0)
SPECIFIC GRAVITY, URINE: 1.017 (ref 1.001–1.035)
WBC, UA: >60 WBC/HPF — AB (ref <=5)
YEAST: NONE SEEN [HPF]

## 2016-03-29 LAB — URINE CULTURE

## 2016-04-01 ENCOUNTER — Other Ambulatory Visit: Payer: Self-pay | Admitting: Gynecology

## 2016-04-01 DIAGNOSIS — R8290 Unspecified abnormal findings in urine: Secondary | ICD-10-CM

## 2016-04-01 DIAGNOSIS — R7309 Other abnormal glucose: Secondary | ICD-10-CM

## 2016-04-02 ENCOUNTER — Other Ambulatory Visit

## 2016-04-02 ENCOUNTER — Other Ambulatory Visit: Payer: Self-pay | Admitting: Gynecology

## 2016-04-02 DIAGNOSIS — R7309 Other abnormal glucose: Secondary | ICD-10-CM

## 2016-04-02 DIAGNOSIS — R8290 Unspecified abnormal findings in urine: Secondary | ICD-10-CM

## 2016-04-02 LAB — URINALYSIS W MICROSCOPIC + REFLEX CULTURE
BILIRUBIN URINE: NEGATIVE
CRYSTALS: NONE SEEN [HPF]
Casts: NONE SEEN [LPF]
Glucose, UA: NEGATIVE
Hgb urine dipstick: NEGATIVE
KETONES UR: NEGATIVE
Nitrite: POSITIVE — AB
PROTEIN: NEGATIVE
Specific Gravity, Urine: 1.007 (ref 1.001–1.035)
Yeast: NONE SEEN [HPF]
pH: 6 (ref 5.0–8.0)

## 2016-04-02 LAB — HEMOGLOBIN A1C
Hgb A1c MFr Bld: 5.6 % (ref <5.7)
MEAN PLASMA GLUCOSE: 114 mg/dL

## 2016-04-02 LAB — GLUCOSE, RANDOM: GLUCOSE: 96 mg/dL (ref 65–99)

## 2016-04-03 ENCOUNTER — Encounter: Payer: Self-pay | Admitting: Gynecology

## 2016-04-04 LAB — URINE CULTURE: Colony Count: >=100000

## 2016-04-08 ENCOUNTER — Encounter: Payer: Self-pay | Admitting: Gynecology

## 2016-04-12 ENCOUNTER — Other Ambulatory Visit: Payer: Self-pay

## 2016-04-12 MED ORDER — CIPROFLOXACIN HCL 250 MG PO TABS: 250.0000 mg | ORAL_TABLET | Freq: Two times a day (BID) | ORAL | 0 refills | Status: DC

## 2017-03-02 DIAGNOSIS — R87610 Atypical squamous cells of undetermined significance on cytologic smear of cervix (ASC-US): Secondary | ICD-10-CM

## 2017-03-02 HISTORY — DX: Atypical squamous cells of undetermined significance on cytologic smear of cervix (ASC-US): R87.610

## 2017-03-28 ENCOUNTER — Encounter: Admitting: Gynecology

## 2017-03-31 ENCOUNTER — Encounter: Payer: Self-pay | Admitting: Gynecology

## 2017-03-31 ENCOUNTER — Ambulatory Visit (INDEPENDENT_AMBULATORY_CARE_PROVIDER_SITE_OTHER): Admitting: Gynecology

## 2017-03-31 VITALS — BP 150/82 | Ht 65.0 in | Wt 146.0 lb

## 2017-03-31 DIAGNOSIS — Z01411 Encounter for gynecological examination (general) (routine) with abnormal findings: Secondary | ICD-10-CM

## 2017-03-31 DIAGNOSIS — Z13220 Encounter for screening for lipoid disorders: Secondary | ICD-10-CM

## 2017-03-31 DIAGNOSIS — Z79890 Hormone replacement therapy (postmenopausal): Secondary | ICD-10-CM | POA: Diagnosis not present

## 2017-03-31 DIAGNOSIS — M8580 Other specified disorders of bone density and structure, unspecified site: Secondary | ICD-10-CM

## 2017-03-31 DIAGNOSIS — N952 Postmenopausal atrophic vaginitis: Secondary | ICD-10-CM | POA: Diagnosis not present

## 2017-03-31 LAB — CBC WITH DIFFERENTIAL/PLATELET
BASOS PCT: 1 %
Basophils Absolute: 46 cells/uL (ref 0–200)
EOS PCT: 1 %
Eosinophils Absolute: 46 cells/uL (ref 15–500)
HCT: 44.3 % (ref 35.0–45.0)
HEMOGLOBIN: 14.6 g/dL (ref 11.7–15.5)
LYMPHS ABS: 1288 {cells}/uL (ref 850–3900)
Lymphocytes Relative: 28 %
MCH: 29.6 pg (ref 27.0–33.0)
MCHC: 33 g/dL (ref 32.0–36.0)
MCV: 89.9 fL (ref 80.0–100.0)
MONOS PCT: 7 %
MPV: 10.4 fL (ref 7.5–12.5)
Monocytes Absolute: 322 cells/uL (ref 200–950)
NEUTROS ABS: 2898 {cells}/uL (ref 1500–7800)
Neutrophils Relative %: 63 %
PLATELETS: 231 10*3/uL (ref 140–400)
RBC: 4.93 MIL/uL (ref 3.80–5.10)
RDW: 13.1 % (ref 11.0–15.0)
WBC: 4.6 10*3/uL (ref 3.8–10.8)

## 2017-03-31 LAB — URINALYSIS W MICROSCOPIC + REFLEX CULTURE
Bacteria, UA: NONE SEEN [HPF]
Bilirubin Urine: NEGATIVE
CASTS: NONE SEEN [LPF]
CRYSTALS: NONE SEEN [HPF]
Glucose, UA: NEGATIVE
Hgb urine dipstick: NEGATIVE
Ketones, ur: NEGATIVE
Leukocytes, UA: NEGATIVE
NITRITE: NEGATIVE
Protein, ur: NEGATIVE
RBC / HPF: NONE SEEN RBC/HPF (ref <=2)
SPECIFIC GRAVITY, URINE: 1.008 (ref 1.001–1.035)
Squamous Epithelial / LPF: NONE SEEN [HPF] (ref <=5)
WBC, UA: NONE SEEN WBC/HPF (ref <=5)
YEAST: NONE SEEN [HPF]
pH: 6 (ref 5.0–8.0)

## 2017-03-31 LAB — LIPID PANEL
Cholesterol: 201 mg/dL — ABNORMAL HIGH (ref <200)
HDL: 52 mg/dL (ref >50)
LDL Cholesterol: 121 mg/dL — ABNORMAL HIGH (ref <100)
Total CHOL/HDL Ratio: 3.9 Ratio (ref <5.0)
Triglycerides: 142 mg/dL (ref <150)
VLDL: 28 mg/dL (ref <30)

## 2017-03-31 LAB — COMPREHENSIVE METABOLIC PANEL
ALBUMIN: 4.2 g/dL (ref 3.6–5.1)
ALT: 10 U/L (ref 6–29)
AST: 16 U/L (ref 10–35)
Alkaline Phosphatase: 47 U/L (ref 33–130)
BILIRUBIN TOTAL: 1.5 mg/dL — AB (ref 0.2–1.2)
BUN: 15 mg/dL (ref 7–25)
CO2: 22 mmol/L (ref 20–31)
CREATININE: 0.79 mg/dL (ref 0.50–1.05)
Calcium: 8.7 mg/dL (ref 8.6–10.4)
Chloride: 101 mmol/L (ref 98–110)
Glucose, Bld: 88 mg/dL (ref 65–99)
Potassium: 3.8 mmol/L (ref 3.5–5.3)
SODIUM: 138 mmol/L (ref 135–146)
TOTAL PROTEIN: 6.6 g/dL (ref 6.1–8.1)

## 2017-03-31 MED ORDER — PROGESTERONE MICRONIZED 100 MG PO CAPS
ORAL_CAPSULE | ORAL | 12 refills | Status: DC
Start: 2017-03-31 — End: 2018-04-01

## 2017-03-31 MED ORDER — ESTRADIOL 0.05 MG/24HR TD PTTW: 1.0000 | MEDICATED_PATCH | TRANSDERMAL | 12 refills | Status: DC

## 2017-03-31 NOTE — Progress Notes (Signed)
    BILLYE PICKEREL [birth year] [id number]        61 y.o.  G2P2 for annual exam.  Doing well without complaints. Continues on HRT and wants to continue.  Past medical history,surgical history, problem list, medications, allergies, family history and social history were all reviewed and documented as reviewed in the EPIC chart.  ROS:  Performed with pertinent positives and negatives included in the history, assessment and plan.   Additional significant findings :  None   Exam: Wandra Scot assistant Vitals:   03/31/17 0828  BP: (!) 150/82  Weight: 146 lb (66.2 kg)  Height: 5\' 5"  (1.651 m)   Body mass index is 24.3 kg/m.  General appearance:  Normal affect, orientation and appearance. Skin: Grossly normal HEENT: Without gross lesions.  No cervical or supraclavicular adenopathy. Thyroid normal.  Lungs:  Clear without wheezing, rales or rhonchi Cardiac: RR, without RMG Abdominal:  Soft, nontender, without masses, guarding, rebound, organomegaly or hernia Breasts:  Examined lying and sitting without masses, retractions, discharge or axillary adenopathy. Pelvic:  Ext, BUS, Vagina: With atrophic changes  Cervix: With atrophic changes. Pap smear done  Uterus: Anteverted, normal size, shape and contour, midline and mobile nontender   Adnexa: Without masses or tenderness    Anus and perineum: Normal   Rectovaginal: Normal sphincter tone without palpated masses or tenderness.    Assessment/Plan:  61 y.o. G2P2 female for annual exam.   1. HRT. Patient continues on Vivelle 0.05 mg patch and Prometrium 100 mg nightly. Is doing well and wants to continue. No vaginal bleeding. I again reviewed the issues of HRT, risks versus benefits. Thrombosis such as stroke heart attack DVT and breast cancer issues reviewed. Benefits as far as symptom relief and possible cardiovascular and bone health and started early. She does have a history of osteopenia. After lengthy discussion the patient wants to  continue and I refilled her 1 year. 2. Osteopenia. DEXA 04/2015 T score -1.7. FRAX 17%/0.7%. Repeat DEXA now a 2 year interval and patient will schedule. Check vitamin D level today. Does supplement vitamin D and calcium. 3. Pap smear 03/2014. Pap smear done today. No history of significant abnormal Pap smears. 4. Mammography scheduled end of August. Breast exam normal today. SBE monthly reviewed. 5. Colonoscopy 2016. Repeat at their recommended interval. 6. Health maintenance. Patient requests baseline labs. CBC, CMP, lipid profile, urinalysis and vitamin D ordered. Blood pressure 150/82 discussed. She notes elevations when she sees physicians but normal when she checks it otherwise. We'll continue to monitor on her own as long as remains normal then will follow. If would remain elevated she will follow up with her primary physician. Follow up in one year for annual exam, sooner as needed.   Anastasio Auerbach MD, 8:53 AM 03/31/2017

## 2017-03-31 NOTE — Patient Instructions (Signed)

## 2017-04-01 ENCOUNTER — Encounter: Payer: Self-pay | Admitting: *Deleted

## 2017-04-01 LAB — PAP IG W/ RFLX HPV ASCU

## 2017-04-01 LAB — VITAMIN D 25 HYDROXY (VIT D DEFICIENCY, FRACTURES): VIT D 25 HYDROXY: 38 ng/mL (ref 30–100)

## 2017-04-02 LAB — HUMAN PAPILLOMAVIRUS, HIGH RISK: HPV DNA High Risk: NOT DETECTED

## 2017-04-03 ENCOUNTER — Encounter: Payer: Self-pay | Admitting: Gynecology

## 2017-04-28 LAB — HM MAMMOGRAPHY

## 2017-05-08 ENCOUNTER — Ambulatory Visit (INDEPENDENT_AMBULATORY_CARE_PROVIDER_SITE_OTHER)

## 2017-05-08 ENCOUNTER — Other Ambulatory Visit: Payer: Self-pay | Admitting: Gynecology

## 2017-05-08 ENCOUNTER — Encounter: Payer: Self-pay | Admitting: Gynecology

## 2017-05-08 DIAGNOSIS — M8580 Other specified disorders of bone density and structure, unspecified site: Secondary | ICD-10-CM

## 2017-05-08 DIAGNOSIS — Z13820 Encounter for screening for osteoporosis: Secondary | ICD-10-CM

## 2017-05-08 DIAGNOSIS — M8589 Other specified disorders of bone density and structure, multiple sites: Secondary | ICD-10-CM

## 2017-10-09 ENCOUNTER — Ambulatory Visit (INDEPENDENT_AMBULATORY_CARE_PROVIDER_SITE_OTHER)
Admission: RE | Admit: 2017-10-09 | Discharge: 2017-10-09 | Disposition: A | Discharge status: Home / Self Care | Source: Ambulatory Visit | Attending: Internal Medicine | Admitting: Internal Medicine

## 2017-10-09 ENCOUNTER — Encounter: Payer: Self-pay | Admitting: Internal Medicine

## 2017-10-09 ENCOUNTER — Ambulatory Visit (INDEPENDENT_AMBULATORY_CARE_PROVIDER_SITE_OTHER): Admitting: Internal Medicine

## 2017-10-09 VITALS — BP 126/78 | HR 100 | Temp 98.4°F | Ht 65.0 in | Wt 146.0 lb

## 2017-10-09 DIAGNOSIS — M79642 Pain in left hand: Secondary | ICD-10-CM

## 2017-10-09 DIAGNOSIS — J069 Acute upper respiratory infection, unspecified: Secondary | ICD-10-CM | POA: Insufficient documentation

## 2017-10-09 DIAGNOSIS — H6983 Other specified disorders of Eustachian tube, bilateral: Secondary | ICD-10-CM | POA: Diagnosis not present

## 2017-10-09 MED ORDER — AZITHROMYCIN 250 MG PO TABS: ORAL_TABLET | ORAL | 1 refills | Status: DC

## 2017-10-09 NOTE — Assessment & Plan Note (Signed)
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

## 2017-10-09 NOTE — Progress Notes (Signed)
Subjective:    Patient ID: Joanne Prince, female    DOB: 1956/08/09, 62 y.o.   MRN: [id number]  HPI    Here with 2-3 days acute onset fever, facial pain, bilat ear pressure with popping and crackling, headache, general weakness and malaise, and greenish d/c, with mild ST and cough, but pt denies chest pain, wheezing, increased sob or doe, orthopnea, PND, increased LE swelling, palpitations, dizziness or syncope.   Also c/o left third MCP is sore and tender with some bony change she has noticed on palpation.   Past Medical History:  Diagnosis Date  . ALLERGIC RHINITIS 07/01/2007   Qualifier: Diagnosis of  By: Jenny Reichmann MD, [facility] Oris   . ASCUS of cervix with negative high risk HPV 03/2017  . [place] DISEASE, LUMBAR 07/01/2007   Qualifier: Diagnosis of  By: Jenny Reichmann MD, [place], HX OF 07/01/2007   Qualifier: Diagnosis of  By: Jenny Reichmann MD, Hunt Oris   . History of indigestion   . HYPERLIPIDEMIA 07/26/2010   Qualifier: Diagnosis of  By: Jenny Reichmann MD, [facility] Oris   . Kidney stones   . MITRAL VALVE PROLAPSE, HX OF 07/01/2007   Qualifier: Diagnosis of  By: Jenny Reichmann MD, [facility] Oris   . NEPHROLITHIASIS, HX OF 10/28/2007   Qualifier: Diagnosis of  By: Linda Hedges MD, Worthy Rancher, LOCAL NOS, SHOULDER 07/01/2007   Qualifier: Diagnosis of  By: Jenny Reichmann MD, [facility] Oris   . OSTEOPENIA 05/2017   T score -1.9 FRAX 7%/0.7% stable from prior DEXA   Past Surgical History:  Procedure Laterality Date  . cholecystectomy    . LASIX Bilateral   . uterine polypectomy    . WISDOM TOOTH EXTRACTION      reports that  has never smoked. she has never used smokeless tobacco. She reports that she drinks alcohol. She reports that she does not use drugs. family history includes Celiac disease in her maternal uncle; Hypertension in her mother. No Known Allergies\ Current Outpatient Medications on File Prior to Visit  Medication Sig Dispense Refill  . CALCIUM PO Take 1 tablet by mouth daily.    Marland Kitchen estradiol (VIVELLE-DOT) 0.05  MG/24HR patch Place 1 patch (0.05 mg total) onto the skin 2 (two) times a week. 8 patch 12  . Multiple Vitamin (MULTIVITAMIN) capsule Take 1 capsule by mouth daily.    . progesterone (PROMETRIUM) 100 MG capsule take 1 capsule by mouth ONCE AT BEDTIME 30 capsule 12   No current facility-administered medications on file prior to visit.    Review of Systems  Constitutional: Negative for other unusual diaphoresis or sweats HENT: Negative for ear discharge or swelling Eyes: Negative for other worsening visual disturbances Respiratory: Negative for stridor or other swelling  Gastrointestinal: Negative for worsening distension or other blood Genitourinary: Negative for retention or other urinary change Musculoskeletal: Negative for other MSK pain or swelling Skin: Negative for color change or other new lesions Neurological: Negative for worsening tremors and other numbness  Psychiatric/Behavioral: Negative for worsening agitation or other fatigue All other system neg per pt    Objective:   Physical Exam BP 126/78   Pulse 100   Temp 98.4 F (36.9 C) (Oral)   Ht 5\' 5"  (1.651 m)   Wt 146 lb (66.2 kg)   LMP 03/08/2005   SpO2 98%   BMI 24.30 kg/m  VS noted, mild ill Constitutional: Pt appears in NAD HENT: Head: NCAT.  Right Ear: External ear normal.  Left  Ear: External ear normal.  Eyes: . Pupils are equal, round, and reactive to light. Conjunctivae and EOM are normal Bilat tm's with mild erythema.  Max sinus areas mild tender.  Pharynx with mild erythema, no exudate Nose: without d/c or deformity Neck: Neck supple. Gross normal ROM Cardiovascular: Normal rate and regular rhythm.   Pulmonary/Chest: Effort normal and breath sounds without rales or wheezing.  Left third MCP with bony degenerative change with mild tender, minimal soft tissue swelling Neurological: Pt is alert. At baseline orientation, motor grossly intact Skin: Skin is warm. No rashes, other new lesions, no LE  edema Psychiatric: Pt behavior is normal without agitation  No other exam findings    Assessment & Plan:

## 2017-10-09 NOTE — Patient Instructions (Signed)
Please take all new medication as prescribed - the antibiotic  You can also take Mucinex DM (or it's generic off brand) for congestion, and tylenol as needed for pain.  Please continue all other medications as before, and refills have been done if requested.  Please have the pharmacy call with any other refills you may need.  Please keep your appointments with your specialists as you may have planned  Please go to the XRAY Department in the Basement (go straight as you get off the elevator) for the x-ray testing  You will be contacted by phone if any changes need to be made immediately.  Otherwise, you will receive a letter about your results with an explanation, but please check with MyChart first.  Please remember to sign up for MyChart if you have not done so, as this will be important to you in the future with finding out test results, communicating by private email, and scheduling acute appointments online when needed.

## 2017-10-12 ENCOUNTER — Encounter: Payer: Self-pay | Admitting: Internal Medicine

## 2017-10-12 NOTE — Assessment & Plan Note (Signed)
For mucinex otc prn,  to f/u any worsening symptoms or concerns  

## 2017-10-12 NOTE — Assessment & Plan Note (Signed)
Mild to mod, for antibx course,  to f/u any worsening symptoms or concerns 

## 2018-04-01 ENCOUNTER — Encounter: Payer: Self-pay | Admitting: Gynecology

## 2018-04-01 ENCOUNTER — Ambulatory Visit (INDEPENDENT_AMBULATORY_CARE_PROVIDER_SITE_OTHER): Admitting: Gynecology

## 2018-04-01 VITALS — BP 150/82 | Ht 65.0 in | Wt 145.0 lb

## 2018-04-01 DIAGNOSIS — Z79890 Hormone replacement therapy (postmenopausal): Secondary | ICD-10-CM | POA: Diagnosis not present

## 2018-04-01 DIAGNOSIS — Z01419 Encounter for gynecological examination (general) (routine) without abnormal findings: Secondary | ICD-10-CM

## 2018-04-01 DIAGNOSIS — M8580 Other specified disorders of bone density and structure, unspecified site: Secondary | ICD-10-CM | POA: Diagnosis not present

## 2018-04-01 MED ORDER — PROGESTERONE MICRONIZED 100 MG PO CAPS: ORAL_CAPSULE | ORAL | 12 refills | Status: DC

## 2018-04-01 MED ORDER — ESTRADIOL 0.05 MG/24HR TD PTTW: 1.0000 | MEDICATED_PATCH | TRANSDERMAL | 12 refills | Status: DC

## 2018-04-01 NOTE — Patient Instructions (Signed)
Follow-up in 1 year for annual exam.  Call if any issues with your hormone replacement therapy or you have any bleeding.

## 2018-04-01 NOTE — Progress Notes (Signed)
    Joanne Prince 1956/08/23 [id number]        62 y.o.  G2P2 for annual gynecologic exam.  Doing well without gynecologic complaints  Past medical history,surgical history, problem list, medications, allergies, family history and social history were all reviewed and documented as reviewed in the EPIC chart.  ROS:  Performed with pertinent positives and negatives included in the history, assessment and plan.   Additional significant findings : None   Exam: Wandra Scot assistant Vitals:   04/01/18 0823  BP: (!) 150/82  Weight: 145 lb (65.8 kg)  Height: 5\' 5"  (1.651 m)   Body mass index is 24.13 kg/m.  General appearance:  Normal affect, orientation and appearance. Skin: Grossly normal HEENT: Without gross lesions.  No cervical or supraclavicular adenopathy. Thyroid normal.  Lungs:  Clear without wheezing, rales or rhonchi Cardiac: RR, without RMG Abdominal:  Soft, nontender, without masses, guarding, rebound, organomegaly or hernia Breasts:  Examined lying and sitting without masses, retractions, discharge or axillary adenopathy. Pelvic:  Ext, BUS, Vagina: Normal with mild atrophic changes  Cervix: Normal with mild atrophic changes  Uterus: Anteverted, normal size, shape and contour, midline and mobile nontender   Adnexa: Without masses or tenderness    Anus and perineum: Normal   Rectovaginal: Normal sphincter tone without palpated masses or tenderness.    Assessment/Plan:  62 y.o. G2P2 female for annual gynecologic exam.   1. Postmenopausal/HRT.  Continues on Vivelle 0.05 mg and Prometrium 100 mg nightly.  Wants to continue.  No bleeding.  We again discussed the risks versus benefits.  Thrombosis such as stroke heart attack DVT in the breast cancer issue versus benefits such as symptom relief cardiovascular and bone health benefits.  At this point patient wants to continue and I refilled her x1 year. 2. Osteopenia.  DEXA 05/2017 T score -1.9 FRAX 7% / 0.7%.  Stable from prior  DEXA.  Plan repeat DEXA in another year or 2. 3. Pap smear 2018.  No Pap smear done today.  No history of significant abnormal Pap smears.  Plan repeat Pap smear at 3-year interval per current screening guidelines. 4. Colonoscopy 2016.  Repeat at their recommended interval. 5. Mammography coming due in August and I reminded her to schedule this.  Breast exam normal today. 6. Health maintenance.  Blood pressure 150/82.  Patient reports that is always elevated at doctor's offices.  She monitors it at home and reports that it is always normal in the 120/70 range.  She will continue to monitor regularly.  No lab work done as patient reports this done elsewhere.  Follow-up 1 year, sooner as needed.   Anastasio Auerbach MD, 8:45 AM 04/01/2018

## 2018-04-29 ENCOUNTER — Encounter: Payer: Self-pay | Admitting: Gynecology

## 2018-10-14 ENCOUNTER — Other Ambulatory Visit: Payer: Self-pay | Admitting: Urology

## 2018-11-03 ENCOUNTER — Other Ambulatory Visit: Payer: Self-pay

## 2018-11-03 ENCOUNTER — Encounter (HOSPITAL_BASED_OUTPATIENT_CLINIC_OR_DEPARTMENT_OTHER): Payer: Self-pay | Admitting: *Deleted

## 2018-11-03 NOTE — Progress Notes (Signed)
Spoke with patient via telephone for pre op interview. NPO after MN. Will need BMET for [place]. Arrival time 0630.

## 2018-11-06 ENCOUNTER — Encounter (HOSPITAL_BASED_OUTPATIENT_CLINIC_OR_DEPARTMENT_OTHER)
Admission: RE | Disposition: A | Discharge status: Home / Self Care | Payer: Self-pay | Source: Home / Self Care | Attending: Urology

## 2018-11-06 ENCOUNTER — Ambulatory Visit (HOSPITAL_BASED_OUTPATIENT_CLINIC_OR_DEPARTMENT_OTHER)
Admission: RE | Admit: 2018-11-06 | Discharge: 2018-11-06 | Disposition: A | Discharge status: Home / Self Care | Attending: Urology | Admitting: Urology

## 2018-11-06 ENCOUNTER — Ambulatory Visit (HOSPITAL_BASED_OUTPATIENT_CLINIC_OR_DEPARTMENT_OTHER): Admitting: Anesthesiology

## 2018-11-06 ENCOUNTER — Encounter (HOSPITAL_BASED_OUTPATIENT_CLINIC_OR_DEPARTMENT_OTHER): Payer: Self-pay | Admitting: *Deleted

## 2018-11-06 ENCOUNTER — Other Ambulatory Visit: Payer: Self-pay

## 2018-11-06 DIAGNOSIS — Z87442 Personal history of urinary calculi: Secondary | ICD-10-CM | POA: Diagnosis not present

## 2018-11-06 DIAGNOSIS — N201 Calculus of ureter: Secondary | ICD-10-CM | POA: Insufficient documentation

## 2018-11-06 DIAGNOSIS — I341 Nonrheumatic mitral (valve) prolapse: Secondary | ICD-10-CM | POA: Diagnosis not present

## 2018-11-06 DIAGNOSIS — M1990 Unspecified osteoarthritis, unspecified site: Secondary | ICD-10-CM | POA: Insufficient documentation

## 2018-11-06 HISTORY — DX: Cardiac murmur, unspecified: R01.1

## 2018-11-06 HISTORY — PX: HOLMIUM LASER APPLICATION: SHX5852

## 2018-11-06 HISTORY — PX: CYSTOSCOPY WITH RETROGRADE PYELOGRAM, URETEROSCOPY AND STENT PLACEMENT: SHX5789

## 2018-11-06 HISTORY — DX: Personal history of urinary calculi: Z87.442

## 2018-11-06 LAB — BASIC METABOLIC PANEL
Anion gap: 9 (ref 5–15)
BUN: 16 mg/dL (ref 8–23)
CHLORIDE: 107 mmol/L (ref 98–111)
CO2: 23 mmol/L (ref 22–32)
Calcium: 8.7 mg/dL — ABNORMAL LOW (ref 8.9–10.3)
Creatinine, Ser: 0.75 mg/dL (ref 0.44–1.00)
Glucose, Bld: 108 mg/dL — ABNORMAL HIGH (ref 70–99)
Potassium: 3.7 mmol/L (ref 3.5–5.1)
Sodium: 139 mmol/L (ref 135–145)

## 2018-11-06 SURGERY — CYSTOURETEROSCOPY, WITH RETROGRADE PYELOGRAM AND STENT INSERTION
Anesthesia: General | Laterality: Left

## 2018-11-06 MED ORDER — SENNOSIDES-DOCUSATE SODIUM 8.6-50 MG PO TABS: 1.0000 | ORAL_TABLET | Freq: Two times a day (BID) | ORAL | 0 refills | Status: DC

## 2018-11-06 MED ORDER — FENTANYL CITRATE (PF) 100 MCG/2ML IJ SOLN
INTRAMUSCULAR | Status: AC
  Filled 2018-11-06: qty 2

## 2018-11-06 MED ORDER — OXYCODONE-ACETAMINOPHEN 5-325 MG PO TABS: 1.0000 | ORAL_TABLET | Freq: Four times a day (QID) | ORAL | 0 refills | Status: DC | PRN

## 2018-11-06 MED ORDER — PROPOFOL 10 MG/ML IV BOLUS
INTRAVENOUS | Status: AC
  Filled 2018-11-06: qty 40

## 2018-11-06 MED ORDER — EPHEDRINE SULFATE 50 MG/ML IJ SOLN
INTRAMUSCULAR | Status: DC | PRN
  Administered 2018-11-06: 7 mg via INTRAVENOUS

## 2018-11-06 MED ORDER — CEPHALEXIN 500 MG PO CAPS: 500.0000 mg | ORAL_CAPSULE | Freq: Two times a day (BID) | ORAL | 0 refills | Status: DC

## 2018-11-06 MED ORDER — LACTATED RINGERS IV SOLN
INTRAVENOUS | Status: DC
  Administered 2018-11-06 (×2): via INTRAVENOUS
  Filled 2018-11-06: qty 1000

## 2018-11-06 MED ORDER — MIDAZOLAM HCL 2 MG/2ML IJ SOLN
INTRAMUSCULAR | Status: AC
  Filled 2018-11-06: qty 2

## 2018-11-06 MED ORDER — FENTANYL CITRATE (PF) 100 MCG/2ML IJ SOLN
25.0000 ug | INTRAMUSCULAR | Status: DC | PRN
  Filled 2018-11-06: qty 1

## 2018-11-06 MED ORDER — ONDANSETRON HCL 4 MG/2ML IJ SOLN
INTRAMUSCULAR | Status: AC
  Filled 2018-11-06: qty 2

## 2018-11-06 MED ORDER — DEXAMETHASONE SODIUM PHOSPHATE 10 MG/ML IJ SOLN
INTRAMUSCULAR | Status: AC
  Filled 2018-11-06: qty 1

## 2018-11-06 MED ORDER — EPHEDRINE 5 MG/ML INJ
INTRAVENOUS | Status: AC
  Filled 2018-11-06: qty 10

## 2018-11-06 MED ORDER — OXYCODONE HCL 5 MG PO TABS
5.0000 mg | ORAL_TABLET | Freq: Once | ORAL | Status: DC | PRN
  Filled 2018-11-06: qty 1

## 2018-11-06 MED ORDER — ONDANSETRON HCL 4 MG/2ML IJ SOLN
4.0000 mg | Freq: Once | INTRAMUSCULAR | Status: DC | PRN
  Filled 2018-11-06: qty 2

## 2018-11-06 MED ORDER — PROPOFOL 10 MG/ML IV BOLUS
INTRAVENOUS | Status: DC | PRN
  Administered 2018-11-06: 160 mg via INTRAVENOUS

## 2018-11-06 MED ORDER — ONDANSETRON HCL 4 MG/2ML IJ SOLN
INTRAMUSCULAR | Status: DC | PRN
  Administered 2018-11-06: 4 mg via INTRAVENOUS

## 2018-11-06 MED ORDER — LIDOCAINE HCL 1 % IJ SOLN
INTRAMUSCULAR | Status: DC | PRN
  Administered 2018-11-06: 50 mg via INTRADERMAL

## 2018-11-06 MED ORDER — MIDAZOLAM HCL 5 MG/5ML IJ SOLN
INTRAMUSCULAR | Status: DC | PRN
  Administered 2018-11-06: 2 mg via INTRAVENOUS

## 2018-11-06 MED ORDER — GENTAMICIN SULFATE 40 MG/ML IJ SOLN
5.0000 mg/kg | INTRAVENOUS | Status: AC
  Administered 2018-11-06: 330 mg via INTRAVENOUS
  Filled 2018-11-06: qty 8.25

## 2018-11-06 MED ORDER — DEXAMETHASONE SODIUM PHOSPHATE 4 MG/ML IJ SOLN
INTRAMUSCULAR | Status: DC | PRN
  Administered 2018-11-06: 4 mg via INTRAVENOUS

## 2018-11-06 MED ORDER — IOHEXOL 300 MG/ML  SOLN
INTRAMUSCULAR | Status: DC | PRN
  Administered 2018-11-06: 15 mL

## 2018-11-06 MED ORDER — OXYCODONE HCL 5 MG/5ML PO SOLN
5.0000 mg | Freq: Once | ORAL | Status: DC | PRN
  Filled 2018-11-06: qty 5

## 2018-11-06 MED ORDER — FENTANYL CITRATE (PF) 100 MCG/2ML IJ SOLN
INTRAMUSCULAR | Status: DC | PRN
  Administered 2018-11-06: 25 ug via INTRAVENOUS
  Administered 2018-11-06: 50 ug via INTRAVENOUS
  Administered 2018-11-06: 25 ug via INTRAVENOUS

## 2018-11-06 MED ORDER — KETOROLAC TROMETHAMINE 10 MG PO TABS: 10.0000 mg | ORAL_TABLET | Freq: Four times a day (QID) | ORAL | 0 refills | Status: DC | PRN

## 2018-11-06 SURGICAL SUPPLY — 26 items
BAG DRAIN URO-CYSTO SKYTR STRL (DRAIN) ×3 IMPLANT
BAG DRN UROCATH (DRAIN) ×1
BASKET LASER NITINOL 1.9FR (BASKET) ×2 IMPLANT
BSKT STON RTRVL 120 1.9FR (BASKET) ×1
CATH INTERMIT  6FR 70CM (CATHETERS) IMPLANT
CLOTH BEACON ORANGE TIMEOUT ST (SAFETY) ×3 IMPLANT
FIBER LASER FLEXIVA 365 (UROLOGICAL SUPPLIES) IMPLANT
FIBER LASER TRAC TIP (UROLOGICAL SUPPLIES) ×2 IMPLANT
GLOVE BIO SURGEON STRL SZ7.5 (GLOVE) ×3 IMPLANT
GOWN STRL REUS W/TWL LRG LVL3 (GOWN DISPOSABLE) ×3 IMPLANT
GUIDEWIRE ANG ZIPWIRE 038X150 (WIRE) ×3 IMPLANT
GUIDEWIRE STR DUAL SENSOR (WIRE) ×3 IMPLANT
INFUSOR MANOMETER BAG 3000ML (MISCELLANEOUS) ×3 IMPLANT
IV NS 1000ML (IV SOLUTION) ×3
IV NS 1000ML BAXH (IV SOLUTION) ×1 IMPLANT
IV NS IRRIG 3000ML ARTHROMATIC (IV SOLUTION) ×3 IMPLANT
KIT TURNOVER CYSTO (KITS) ×3 IMPLANT
MANIFOLD NEPTUNE II (INSTRUMENTS) ×3 IMPLANT
NS IRRIG 500ML POUR BTL (IV SOLUTION) ×6 IMPLANT
PACK CYSTO (CUSTOM PROCEDURE TRAY) ×3 IMPLANT
STENT POLARIS 5FRX22 (STENTS) ×2 IMPLANT
SYR 10ML LL (SYRINGE) ×3 IMPLANT
TUBE CONNECTING 12'X1/4 (SUCTIONS) ×1
TUBE CONNECTING 12X1/4 (SUCTIONS) ×1 IMPLANT
TUBE FEEDING 8FR 16IN STR KANG (MISCELLANEOUS) ×2 IMPLANT
TUBING UROLOGY SET (TUBING) IMPLANT

## 2018-11-06 NOTE — Anesthesia Preprocedure Evaluation (Addendum)
Anesthesia Evaluation  Patient identified by MRN, date of birth, ID band Patient awake    Reviewed: Allergy & Precautions, NPO status , Patient's Chart, lab work & pertinent test results  History of Anesthesia Complications Negative for: history of anesthetic complications  Airway Mallampati: I  TM Distance: >3 FB Neck ROM: Full    Dental no notable dental hx.    Pulmonary neg pulmonary ROS,    Pulmonary exam normal breath sounds clear to auscultation       Cardiovascular Normal cardiovascular exam+ Valvular Problems/Murmurs (diagnosed many years ago, has never caused any issues except occasional palpitations) MVP  Rhythm:Regular Rate:Normal - Systolic murmurs and - Diastolic murmurs    Neuro/Psych negative neurological ROS  negative psych ROS   GI/Hepatic negative GI ROS, Neg liver ROS,   Endo/Other  negative endocrine ROS  Renal/GU Renal disease (nephrolithiasis)  negative genitourinary   Musculoskeletal  (+) Arthritis ,   Abdominal   Peds  Hematology negative hematology ROS (+)   Anesthesia Other Findings 63 yo F for cysto/stent - HLD, nephrolithiasis  Reproductive/Obstetrics                            Anesthesia Physical Anesthesia Plan  ASA: II  Anesthesia Plan: General   Post-op Pain Management:    Induction: Intravenous  PONV Risk Score and Plan: 3 and Ondansetron, Dexamethasone, Midazolam and Treatment may vary due to age or medical condition  Airway Management Planned: LMA  Additional Equipment: None  Intra-op Plan:   Post-operative Plan: Extubation in OR  Informed Consent: I have reviewed the patients History and Physical, chart, labs and discussed the procedure including the risks, benefits and alternatives for the proposed anesthesia with the patient or authorized representative who has indicated his/her understanding and acceptance.     Dental advisory  given  Plan Discussed with:   Anesthesia Plan Comments:        Anesthesia Quick Evaluation

## 2018-11-06 NOTE — Anesthesia Postprocedure Evaluation (Signed)
Anesthesia Post Note  Patient: Joanne Prince  Procedure(s) Performed: CYSTOSCOPY WITH RETROGRADE PYELOGRAM, URETEROSCOPY AND STENT PLACEMENT (Left ) HOLMIUM LASER APPLICATION (Left )     Patient location during evaluation: PACU Anesthesia Type: General Level of consciousness: awake and alert Pain management: pain level controlled Vital Signs Assessment: post-procedure vital signs reviewed and stable Respiratory status: spontaneous breathing, nonlabored ventilation and respiratory function stable Cardiovascular status: blood pressure returned to baseline and stable Postop Assessment: no apparent nausea or vomiting Anesthetic complications: no    Last Vitals:  Vitals:   11/06/18 1000 11/06/18 1015  BP: 116/65   Pulse: 66 62  Resp: 16 14  Temp:  (!) 36.4 C  SpO2: 100% 99%    Last Pain:  Vitals:   11/06/18 1015  TempSrc:   PainSc: 0-No pain                 Lidia Collum

## 2018-11-06 NOTE — Discharge Instructions (Signed)
.  1 - You may have urinary urgency (bladder spasms) and bloody urine on / off with stent in place. This is normal.  2 - Remove tethered stent on Monday morning at home by pulling on string, then blue-white plastic tubing, and discarding. Office is open Monday if any problems arise.   3 - Call MD or go to ER for fever >102, severe pain / nausea / vomiting not relieved by medications, or acute change in medical status    CYSTOSCOPY HOME CARE INSTRUCTIONS  Activity: Rest for the remainder of the day.  Do not drive or operate equipment today.  You may resume normal activities in one to two days as instructed by your physician.   Meals: Drink plenty of liquids and eat light foods such as gelatin or soup this evening.  You may return to a normal meal plan tomorrow.  Return to Work: You may return to work in one to two days or as instructed by your physician.  Special Instructions / Symptoms: Call your physician if any of these symptoms occur:   -persistent or heavy bleeding  -bleeding which continues after first few urination  -large blood clots that are difficult to pass  -urine stream diminishes or stops completely  -fever equal to or higher than 101 degrees Farenheit.  -cloudy urine with a strong, foul odor  -severe pain  Females should always wipe from front to back after elimination.  You may feel some burning pain when you urinate.  This should disappear with time.  Applying moist heat to the lower abdomen or a hot tub bath may help relieve the pain. \  Follow-Up / Date of Return Visit to Your Physician:  Call for an appointment to arrange follow-up.  Patient Signature:  ________________________________________________________  Nurse's Signature:  ________________________________________________________      Post Anesthesia Home Care Instructions  Activity: Get plenty of rest for the remainder of the day. A responsible individual must stay with you for 24 hours following  the procedure.  For the next 24 hours, DO NOT: -Drive a car -Paediatric nurse -Drink alcoholic beverages -Take any medication unless instructed by your physician -Make any legal decisions or sign important papers.  Meals: Start with liquid foods such as gelatin or soup. Progress to regular foods as tolerated. Avoid greasy, spicy, heavy foods. If nausea and/or vomiting occur, drink only clear liquids until the nausea and/or vomiting subsides. Call your physician if vomiting continues.  Special Instructions/Symptoms: Your throat may feel dry or sore from the anesthesia or the breathing tube placed in your throat during surgery. If this causes discomfort, gargle with warm salt water. The discomfort should disappear within 24 hours.  If you had a scopolamine patch placed behind your ear for the management of post- operative nausea and/or vomiting:  1. The medication in the patch is effective for 72 hours, after which it should be removed.  Wrap patch in a tissue and discard in the trash. Wash hands thoroughly with soap and water. 2. You may remove the patch earlier than 72 hours if you experience unpleasant side effects which may include dry mouth, dizziness or visual disturbances. 3. Avoid touching the patch. Wash your hands with soap and water after contact with the patch.

## 2018-11-06 NOTE — H&P (Signed)
Joanne Prince is an 63 y.o. female.    Chief Complaint: Pre-op LEFT Ureterosopic Stone Manipulation  HPI:   1 - LEFT Distal Ureteral Stone - 85mm left distal stone by KUB since 09/2018 on eval left flank/groin pain. UCX negative. No large ipsilateral additional stones by plain film.  Today "Joanne Prince" is seen to proceed with LEFT ureteroscopic stone maniulaiton. NO interval fevers.   Past Medical History:  Diagnosis Date  . ALLERGIC RHINITIS 07/01/2007   Qualifier: Diagnosis of  By: Jenny Reichmann MD, [facility] Oris   . ASCUS of cervix with negative high risk HPV 03/2017  . [place] DISEASE, LUMBAR 07/01/2007   Qualifier: Diagnosis of  By: Jenny Reichmann MD, [place], HX OF 07/01/2007   Qualifier: Diagnosis of  By: Jenny Reichmann MD, [facility] Oris   . Heart murmur   . History of indigestion   . History of kidney stones   . HYPERLIPIDEMIA 07/26/2010   Qualifier: Diagnosis of  By: Jenny Reichmann MD, [facility] Oris   . Kidney stones   . MITRAL VALVE PROLAPSE, HX OF 07/01/2007   Qualifier: Diagnosis of  By: Jenny Reichmann MD, [facility] Oris   . NEPHROLITHIASIS, HX OF 10/28/2007   Qualifier: Diagnosis of  By: Linda Hedges MD, Worthy Rancher, LOCAL NOS, SHOULDER 07/01/2007   Qualifier: Diagnosis of  By: Jenny Reichmann MD, [facility] Oris   . OSTEOPENIA 05/2017   T score -1.9 FRAX 7%/0.7% stable from prior DEXA    Past Surgical History:  Procedure Laterality Date  . cholecystectomy    . LASIX Bilateral   . uterine polypectomy    . WISDOM TOOTH EXTRACTION      Family History  Problem Relation Age of Onset  . Hypertension Mother   . Celiac disease Maternal Uncle    Social History:  reports that she has never smoked. She has never used smokeless tobacco. She reports current alcohol use. She reports that she does not use drugs.  Allergies: No Known Allergies  No medications prior to admission.    No results found for this or any previous visit (from the past 48 hour(s)). No results found.  Review of Systems  Constitutional: Negative.  Negative for  chills and fever.  Genitourinary: Positive for flank pain.  All other systems reviewed and are negative.   Height 5\' 5"  (1.651 m), weight 65.8 kg, last menstrual period 03/08/2005. Physical Exam  Constitutional: She appears well-developed.  HENT:  Head: Normocephalic.  Eyes: Pupils are equal, round, and reactive to light.  Neck: Normal range of motion.  Cardiovascular: Normal rate.  Respiratory: Effort normal.  GI: Soft.  Genitourinary:    Genitourinary Comments: Mild left CVAT at present   Musculoskeletal: Normal range of motion.  Neurological: She is alert.  Skin: Skin is warm.  Psychiatric: She has a normal mood and affect.     Assessment/Plan  Proceed as planned with LEFT ureteroscopy for stone. Risks, benefits, alternatives, expected peri-op course discussed.   Alexis Frock, MD 11/06/2018, 5:34 AM

## 2018-11-06 NOTE — Op Note (Signed)
NAME: Joanne Prince, Joanne Prince MEDICAL RECORD EX:[id number] ACCOUNT [id number] DATE OF BIRTH:1955-12-06 FACILITY: [facility] LOCATION: [facility] PHYSICIAN:Arin Peral, MD  OPERATIVE REPORT  DATE OF PROCEDURE:  11/06/2018  PREOPERATIVE DIAGNOSIS:  Left ureteral stone.  PROCEDURE: 1.  Cystoscopy with left retrograde pyelogram, interpretation. 2.  Left ureteroscopy with laser lithotripsy. 3.  Insertion of left ureteral stent, 5 x 22 Polaris with tether.  ESTIMATED BLOOD LOSS:  Nil.  COMPLICATIONS:  None.  SPECIMENS:  Left ureteral stone for composition analysis.  FINDINGS: 1.  Unremarkable urinary bladder. 2.  Mildly obstructing left distal ureteral stone. 3.  Complete resolution of all accessible stone fragments larger than one-third millimeter following laser lithotripsy and basket extraction. 4.  Successful placement of left ureteral stent proximal end in the renal pelvis, distal end in urinary bladder.  INDICATIONS:  The patient is a pleasant 63 year old lady with history of recurrent nephrolithiasis.  She was found on workup of colicky flank pain and left distal ureteral stone approximately 6 weeks ago.  The stone appeared by x-ray criteria to be  amenable to a trial of medical passage and she has been on a trial of medical passage for over a month.  However, she has unfortunately failed to pass her stone and still has symptoms of intermittent colic.  Further options were discussed including  continued medical therapy versus shockwave lithotripsy versus ureteroscopy, and she wished to proceed with the latter, given the distal stone location.  Informed consent was obtained and placed in medical record  tell.  DESCRIPTION OF PROCEDURE:  The patient being Joanne Prince verified, procedure being left ureteroscopic stone manipulation was confirmed.  Procedure timeout was performed.  Intravenous antibiotics administered.  General LMA anesthesia introduced.  The  patient was placed into a low lithotomy  position.  Sterile field was created by prepping and draping the patient's vagina, introitus and proximal thighs using iodine.  Cystourethroscopy was performed using a 22-French rigid cystoscope with offset lens.   Inspection of the urinary bladder revealed no diverticula, calcifications, papillary lesions.  Ureteral orifices were singleton bilaterally.  The left ureteral orifice was cannulated with a  6-French renal catheter and left retrograde pyelogram was obtained.  Left retrograde pyelogram demonstrated a single left ureter, single system left kidney.  There was a mobile filling defect in the distal ureter consistent with known stone.  A 0.038 ZIPwire was advanced to lower pole and set aside as a safety wire.  An  8-French feeding tube was placed in the urinary bladder for pressure release, and semirigid ureteroscopy was performed of the distal left ureter alongside a separate sensor working wire.  As expected, there was a distal left ureteral stone with mild to  moderate mucosal edema around this.  It appeared to be too large for simple basketing.  As such, holmium laser energy applied 70 setting of 0.3 joules and 30 Hz and the stone was fragmented into approximately 4 smaller pieces that were then sequentially  grasped with the long axis with an escape basket removed and set aside for analysis.  Following additional semi-rigid ureteroscopy was performed of the remainder of the entire length of the left ureter alongside the stent with a separate sensor working  wire.  No mucosal and no mucosal abnormalities were seen.  As the goal today was to verify rendering the patient free the semirigid scope was exchanged for a dual digital flexible ureteroscope over the sensor working wire to the level of the kidney and  flexible digital ureteroscopy was performed of  the left kidney, including all calices x2.  No evidence of intraluminal stones was seen whatsoever within the kidney.  The ureteroscope was removed  under continuous vision.  No significant mucosal  abnormalities were found.  There was some mild mucosal edema at the site of prior stone impaction.  It was felt that brief interval stenting with a tethered stent would be warranted.  As such, a new 5 x 22 Polaris-type stent was placed remaining safety  wire using fluoroscopic guidance.  Good proximal and distal planes were noted.  Tether was left in place and fashioned to the mons pubis and the procedure was terminated.    The patient tolerated the procedure well.  No immediate perioperative complications.  The patient was taken to postanesthesia care in stable condition.  AN/NUANCE  D:11/06/2018 T:11/06/2018 JOB:[id number]

## 2018-11-06 NOTE — Transfer of Care (Signed)
Immediate Anesthesia Transfer of Care Note  Patient: Cedar L Tompson  Procedure(s) Performed: Procedure(s) (LRB): CYSTOSCOPY WITH RETROGRADE PYELOGRAM, URETEROSCOPY AND STENT PLACEMENT (Left) HOLMIUM LASER APPLICATION (Left)  Patient Location: PACU  Anesthesia Type: General  Level of Consciousness: awake, oriented, sedated and patient cooperative  Airway & Oxygen Therapy: Patient Spontanous Breathing and Patient connected to face mask oxygen  Post-op Assessment: Report given to PACU RN and Post -op Vital signs reviewed and stable  Post vital signs: Reviewed and stable  Complications: No apparent anesthesia complications  Last Vitals:  Vitals Value Taken Time  BP 119/69 11/06/2018  9:45 AM  Temp    Pulse 67 11/06/2018  9:47 AM  Resp 12 11/06/2018  9:47 AM  SpO2 97 % 11/06/2018  9:47 AM  Vitals shown include unvalidated device data.  Last Pain:  Vitals:   11/06/18 2589  TempSrc: Oral

## 2018-11-06 NOTE — Anesthesia Procedure Notes (Signed)
Procedure Name: LMA Insertion Date/Time: 11/06/2018 8:57 AM Performed by: Garrel Ridgel, CRNA Pre-anesthesia Checklist: Patient identified, Emergency Drugs available, Suction available, Patient being monitored and Timeout performed Patient Re-evaluated:Patient Re-evaluated prior to induction Oxygen Delivery Method: Circle system utilized Preoxygenation: Pre-oxygenation with 100% oxygen Induction Type: IV induction Ventilation: Mask ventilation without difficulty LMA: LMA inserted LMA Size: 3.0 Number of attempts: 1 Tube secured with: Tape Dental Injury: Teeth and Oropharynx as per pre-operative assessment

## 2018-11-06 NOTE — Brief Op Note (Signed)
11/06/2018  9:31 AM  PATIENT:  Joanne Prince  63 y.o. female  PRE-OPERATIVE DIAGNOSIS:  LEFT URETERAL CALCULI  POST-OPERATIVE DIAGNOSIS:  LEFT URETERAL CALCULI  PROCEDURE:  Procedure(s): CYSTOSCOPY WITH RETROGRADE PYELOGRAM, URETEROSCOPY AND STENT PLACEMENT (Left) HOLMIUM LASER APPLICATION (Left)  SURGEON:  Surgeon(s) and Role:    Alexis Frock, MD - Primary  PHYSICIAN ASSISTANT:   ASSISTANTS: none   ANESTHESIA:   general  EBL:  3 mL   BLOOD ADMINISTERED:none  DRAINS: none   LOCAL MEDICATIONS USED:  NONE  SPECIMEN:  Source of Specimen:  left ureteral stone fragments  DISPOSITION OF SPECIMEN:  [facility] for compositional analysis  COUNTS:  YES  TOURNIQUET:  * No tourniquets in log *  DICTATION: .Other Dictation: Dictation Number [id number]  PLAN OF CARE: Discharge to home after PACU  PATIENT DISPOSITION:  PACU - hemodynamically stable.   Delay start of Pharmacological VTE agent (>24hrs) due to surgical blood loss or risk of bleeding: yes

## 2018-11-09 ENCOUNTER — Encounter (HOSPITAL_BASED_OUTPATIENT_CLINIC_OR_DEPARTMENT_OTHER): Payer: Self-pay | Admitting: Urology

## 2018-11-30 ENCOUNTER — Telehealth: Payer: Self-pay | Admitting: *Deleted

## 2018-11-30 NOTE — Telephone Encounter (Signed)
Called pt Re: 12/01/18 OV scheduled with Dr. Jenny Reichmann. Patient is wanting a OOW note stating she is at high risk for COVID-19 due to her mitral valve prolapse. She states she is already out of work until end of April and is trying to use her sick days (which requires a medical excuse note) to get paid instead of using all of her annual leave days. I spoke with Dr. Jenny Reichmann and he states her MVP does not put her at high risk with COVID-19 like CHF or COPD would be considered high risk. I reassured patient of this and advised per Dr. Jenny Reichmann, he is unable to write OOW note. 12/01/18 OV cancelled.  Patient verbalized understanding.

## 2018-12-01 ENCOUNTER — Ambulatory Visit: Admitting: Internal Medicine

## 2019-04-07 ENCOUNTER — Other Ambulatory Visit: Payer: Self-pay

## 2019-04-08 ENCOUNTER — Encounter: Payer: Self-pay | Admitting: Gynecology

## 2019-04-08 ENCOUNTER — Ambulatory Visit (INDEPENDENT_AMBULATORY_CARE_PROVIDER_SITE_OTHER): Admitting: Gynecology

## 2019-04-08 VITALS — BP 130/80 | Ht 64.5 in | Wt 144.8 lb

## 2019-04-08 DIAGNOSIS — Z79890 Hormone replacement therapy (postmenopausal): Secondary | ICD-10-CM | POA: Diagnosis not present

## 2019-04-08 DIAGNOSIS — Z01419 Encounter for gynecological examination (general) (routine) without abnormal findings: Secondary | ICD-10-CM

## 2019-04-08 DIAGNOSIS — M8580 Other specified disorders of bone density and structure, unspecified site: Secondary | ICD-10-CM

## 2019-04-08 DIAGNOSIS — Z13220 Encounter for screening for lipoid disorders: Secondary | ICD-10-CM | POA: Diagnosis not present

## 2019-04-08 MED ORDER — PROGESTERONE MICRONIZED 100 MG PO CAPS: ORAL_CAPSULE | ORAL | 4 refills | Status: DC

## 2019-04-08 MED ORDER — ESTRADIOL 0.05 MG/24HR TD PTTW: 1.0000 | MEDICATED_PATCH | TRANSDERMAL | 4 refills | Status: DC

## 2019-04-08 NOTE — Addendum Note (Signed)
Addended by: Thurnell Garbe A on: 04/08/2019 08:36 AM   Modules accepted: Orders

## 2019-04-08 NOTE — Progress Notes (Signed)
    BREAHNA BOYLEN 08-02-1956 [id number]        63 y.o.  G2P2 for annual gynecologic exam.  Without gynecologic complaints  Past medical history,surgical history, problem list, medications, allergies, family history and social history were all reviewed and documented as reviewed in the EPIC chart.  ROS:  Performed with pertinent positives and negatives included in the history, assessment and plan.   Additional significant findings : None   Exam: Assistant: Blanca Vitals:   04/08/19 0753  BP: 130/80  Weight: 144 lb 12.8 oz (65.7 kg)  Height: 5' 4.5" (1.638 m)   Body mass index is 24.47 kg/m.  General appearance:  Normal affect, orientation and appearance. Skin: Grossly normal HEENT: Without gross lesions.  No cervical or supraclavicular adenopathy. Thyroid normal.  Lungs:  Clear without wheezing, rales or rhonchi Cardiac: RR, without RMG Abdominal:  Soft, nontender, without masses, guarding, rebound, organomegaly or hernia Breasts:  Examined lying and sitting without masses, retractions, discharge or axillary adenopathy. Pelvic:  Ext, BUS, Vagina: Normal with atrophic changes  Cervix: Normal with atrophic changes.  Pap smear done  Uterus: Anteverted, normal size, shape and contour, midline and mobile nontender   Adnexa: Without masses or tenderness    Anus and perineum: Normal   Rectovaginal: Normal sphincter tone without palpated masses or tenderness.    Assessment/Plan:  63 y.o. G2P2 female for annual gynecologic exam.   1. Postmenopausal/HRT.  Continues on Vivelle 0.05 mg patch and Prometrium 100 mg daily.  Notes when she misses she has a lot of anxiety.  We again discussed the risks versus benefits to include thrombosis such as stroke heart attack DVT and breast cancer issue.  Benefits as far symptom relief, cardiovascular and bone health when started early noting her osteopenia also reviewed.  The patient desires to continue for now and I refilled her x1 year.  No bleeding  and she knows to call if any bleeding. 2. Osteopenia.  DEXA 2018 T score -1.9 FRAX 7% / 0.7%.  Schedule DEXA now at 2-year interval and she will follow-up for this.  Check vitamin D level and TSH today. 3. Mammography coming due and she will follow-up for this.  Breast exam normal today. 4. Pap smear 2018 ASCUS negative high risk HPV.  Pap smear today.  No history of abnormal Pap smears previously. 5. Colonoscopy 2016.  Repeat at their recommended interval. 6. Health maintenance.  Requests fasting blood work.  CBC, CMP, lipid profile, TSH and vitamin D ordered.  Follow-up for DEXA.  Follow-up for annual exam in 1 year.   Anastasio Auerbach MD, 8:27 AM 04/08/2019

## 2019-04-08 NOTE — Patient Instructions (Signed)
Follow-up for the bone density as scheduled.  Follow-up in 1 year for annual exam

## 2019-04-09 ENCOUNTER — Encounter: Payer: Self-pay | Admitting: Gynecology

## 2019-04-09 LAB — LIPID PANEL
Cholesterol: 214 mg/dL — ABNORMAL HIGH (ref <200)
HDL: 53 mg/dL (ref >=50)
LDL Cholesterol (Calc): 133 mg/dL (calc) — ABNORMAL HIGH
Non-HDL Cholesterol (Calc): 161 mg/dL (calc) — ABNORMAL HIGH (ref <130)
Total CHOL/HDL Ratio: 4 (calc) (ref <5.0)
Triglycerides: 151 mg/dL — ABNORMAL HIGH (ref <150)

## 2019-04-09 LAB — VITAMIN D 25 HYDROXY (VIT D DEFICIENCY, FRACTURES): Vit D, 25-Hydroxy: 38 ng/mL (ref 30–100)

## 2019-04-09 LAB — CBC WITH DIFFERENTIAL/PLATELET
Absolute Monocytes: 349 cells/uL (ref 200–950)
Basophils Absolute: 59 cells/uL (ref 0–200)
Basophils Relative: 1.4 %
Eosinophils Absolute: 59 cells/uL (ref 15–500)
Eosinophils Relative: 1.4 %
HCT: 45.7 % — ABNORMAL HIGH (ref 35.0–45.0)
Hemoglobin: 15.2 g/dL (ref 11.7–15.5)
Lymphs Abs: 1226 cells/uL (ref 850–3900)
MCH: 29.7 pg (ref 27.0–33.0)
MCHC: 33.3 g/dL (ref 32.0–36.0)
MCV: 89.4 fL (ref 80.0–100.0)
MPV: 10.5 fL (ref 7.5–12.5)
Monocytes Relative: 8.3 %
Neutro Abs: 2507 cells/uL (ref 1500–7800)
Neutrophils Relative %: 59.7 %
Platelets: 259 10*3/uL (ref 140–400)
RBC: 5.11 10*6/uL — ABNORMAL HIGH (ref 3.80–5.10)
RDW: 11.9 % (ref 11.0–15.0)
Total Lymphocyte: 29.2 %
WBC: 4.2 10*3/uL (ref 3.8–10.8)

## 2019-04-09 LAB — COMPREHENSIVE METABOLIC PANEL
AG Ratio: 1.7 (calc) (ref 1.0–2.5)
ALT: 14 U/L (ref 6–29)
AST: 16 U/L (ref 10–35)
Albumin: 4.4 g/dL (ref 3.6–5.1)
Alkaline phosphatase (APISO): 53 U/L (ref 37–153)
BUN: 16 mg/dL (ref 7–25)
CO2: 27 mmol/L (ref 20–32)
Calcium: 9.3 mg/dL (ref 8.6–10.4)
Chloride: 103 mmol/L (ref 98–110)
Creat: 0.78 mg/dL (ref 0.50–0.99)
Globulin: 2.6 g/dL (calc) (ref 1.9–3.7)
Glucose, Bld: 100 mg/dL — ABNORMAL HIGH (ref 65–99)
Potassium: 3.9 mmol/L (ref 3.5–5.3)
Sodium: 137 mmol/L (ref 135–146)
Total Bilirubin: 1.5 mg/dL — ABNORMAL HIGH (ref 0.2–1.2)
Total Protein: 7 g/dL (ref 6.1–8.1)

## 2019-04-09 LAB — TSH: TSH: 1.65 mIU/L (ref 0.40–4.50)

## 2019-04-14 ENCOUNTER — Encounter: Payer: Self-pay | Admitting: Gynecology

## 2019-04-14 LAB — PAP IG W/ RFLX HPV ASCU

## 2019-04-14 LAB — HUMAN PAPILLOMAVIRUS, HIGH RISK: HPV DNA High Risk: NOT DETECTED

## 2019-04-28 ENCOUNTER — Other Ambulatory Visit: Payer: Self-pay

## 2019-04-29 ENCOUNTER — Ambulatory Visit (INDEPENDENT_AMBULATORY_CARE_PROVIDER_SITE_OTHER): Admitting: Gynecology

## 2019-04-29 ENCOUNTER — Encounter: Payer: Self-pay | Admitting: Gynecology

## 2019-04-29 VITALS — BP 118/76

## 2019-04-29 DIAGNOSIS — N841 Polyp of cervix uteri: Secondary | ICD-10-CM

## 2019-04-29 DIAGNOSIS — R87610 Atypical squamous cells of undetermined significance on cytologic smear of cervix (ASC-US): Secondary | ICD-10-CM

## 2019-04-29 NOTE — Patient Instructions (Signed)
Office will call you with biopsy results 

## 2019-04-29 NOTE — Progress Notes (Signed)
    PERNELLA REINHOLTZ 1956/05/04 [id number]        63 y.o.  G2P2 presents for colposcopy.  Her to last annual Pap smear showed ASCUS negative high risk HPV.  No history of significant abnormal Pap smears previously.  Past medical history,surgical history, problem list, medications, allergies, family history and social history were all reviewed and documented in the EPIC chart.  Directed ROS with pertinent positives and negatives documented in the history of present illness/assessment and plan.  Exam: Caryn Bee assistant Vitals:   04/29/19 1507  BP: 118/76   General appearance:  Normal Abdomen soft nontender without masses guarding rebound Pelvic external BUS vagina normal with atrophic changes.  Cervix grossly normal.  Uterus normal size midline mobile nontender.  Adnexa without masses or tenderness.  Colposcopy performed after acetic acid cleanse was adequate noting transformation zone visualized 360 degrees.  Small endocervical polyp noted but no other abnormalities.  ECC/biopsy of the endocervical polyp performed.  Patient tolerated well.  Physical Exam  Genitourinary:       Assessment/Plan:  63 y.o. G2P2 with 2 annual Pap smears in a row showing ASCUS negative high risk HPV.  Colposcopy was adequate normal excepting small endocervical polyp which was biopsied/ECCed off.  We discussed abnormal Pap smears and dysplasia, high-grade/low-grade, progression/regression.  The patient will follow-up for biopsy results.  If normal/low-grade then plan expectant management with follow-up Pap smear 1 year.  If otherwise then will triage based upon results.  Anastasio Auerbach MD, 3:27 PM 04/29/2019

## 2019-05-03 ENCOUNTER — Encounter: Payer: Self-pay | Admitting: Gynecology

## 2019-05-03 LAB — TISSUE SPECIMEN

## 2019-05-03 LAB — PATHOLOGY REPORT

## 2019-05-05 LAB — HM MAMMOGRAPHY

## 2019-05-13 ENCOUNTER — Encounter

## 2019-05-17 ENCOUNTER — Other Ambulatory Visit: Payer: Self-pay

## 2019-05-18 ENCOUNTER — Other Ambulatory Visit: Payer: Self-pay | Admitting: Gynecology

## 2019-05-18 ENCOUNTER — Ambulatory Visit (INDEPENDENT_AMBULATORY_CARE_PROVIDER_SITE_OTHER)

## 2019-05-18 DIAGNOSIS — Z780 Asymptomatic menopausal state: Secondary | ICD-10-CM

## 2019-05-18 DIAGNOSIS — Z13820 Encounter for screening for osteoporosis: Secondary | ICD-10-CM

## 2019-05-18 DIAGNOSIS — M8580 Other specified disorders of bone density and structure, unspecified site: Secondary | ICD-10-CM

## 2019-05-18 DIAGNOSIS — M8589 Other specified disorders of bone density and structure, multiple sites: Secondary | ICD-10-CM

## 2019-05-19 ENCOUNTER — Encounter: Payer: Self-pay | Admitting: Gynecology

## 2019-06-01 ENCOUNTER — Encounter: Payer: Self-pay | Admitting: Gynecology

## 2019-06-30 ENCOUNTER — Other Ambulatory Visit: Payer: Self-pay

## 2019-06-30 DIAGNOSIS — Z20822 Contact with and (suspected) exposure to covid-19: Secondary | ICD-10-CM

## 2019-07-01 LAB — NOVEL CORONAVIRUS, NAA: SARS-CoV-2, NAA: NOT DETECTED

## 2019-07-06 ENCOUNTER — Other Ambulatory Visit: Payer: Self-pay

## 2019-07-06 DIAGNOSIS — Z20822 Contact with and (suspected) exposure to covid-19: Secondary | ICD-10-CM

## 2019-07-07 LAB — NOVEL CORONAVIRUS, NAA: SARS-CoV-2, NAA: DETECTED — AB

## 2019-07-15 ENCOUNTER — Telehealth: Payer: Self-pay | Admitting: Internal Medicine

## 2019-07-15 MED ORDER — BENZONATATE 100 MG PO CAPS: 200.0000 mg | ORAL_CAPSULE | Freq: Three times a day (TID) | ORAL | 1 refills | Status: DC | PRN

## 2019-07-15 NOTE — Telephone Encounter (Signed)
Pt wants Dr Joanie Coddington nurse to give a CB was diagnosed with COVID Nov the 2. The symptoms left are mainly cough/ needs to take something but has prolapse and wants to make sure what she is taking would be correct. Asking for a FU at [phone]

## 2019-07-15 NOTE — Telephone Encounter (Signed)
Pt has been informed.

## 2019-07-15 NOTE — Addendum Note (Signed)
Addended by: Biagio Borg on: 07/15/2019 12:50 PM   Modules accepted: Orders

## 2019-07-15 NOTE — Telephone Encounter (Signed)
Ok for [employer] perle prn - done erx

## 2019-10-06 ENCOUNTER — Other Ambulatory Visit: Payer: Self-pay

## 2019-10-06 MED ORDER — PROGESTERONE MICRONIZED 100 MG PO CAPS: ORAL_CAPSULE | ORAL | 6 refills | Status: DC

## 2019-10-06 NOTE — Telephone Encounter (Signed)
Requests to change to 30 day quantity instead of 90 days. Rx resent.

## 2020-01-13 ENCOUNTER — Encounter: Payer: Self-pay | Admitting: Internal Medicine

## 2020-01-13 ENCOUNTER — Other Ambulatory Visit: Payer: Self-pay

## 2020-01-13 ENCOUNTER — Ambulatory Visit (INDEPENDENT_AMBULATORY_CARE_PROVIDER_SITE_OTHER): Admitting: Internal Medicine

## 2020-01-13 VITALS — BP 190/109 | HR 81 | Temp 98.0°F | Ht 64.5 in | Wt 143.0 lb

## 2020-01-13 DIAGNOSIS — Z1159 Encounter for screening for other viral diseases: Secondary | ICD-10-CM | POA: Diagnosis not present

## 2020-01-13 DIAGNOSIS — Z0000 Encounter for general adult medical examination without abnormal findings: Secondary | ICD-10-CM | POA: Diagnosis not present

## 2020-01-13 DIAGNOSIS — S70262A Insect bite (nonvenomous), left hip, initial encounter: Secondary | ICD-10-CM | POA: Diagnosis not present

## 2020-01-13 DIAGNOSIS — Z114 Encounter for screening for human immunodeficiency virus [HIV]: Secondary | ICD-10-CM

## 2020-01-13 DIAGNOSIS — E538 Deficiency of other specified B group vitamins: Secondary | ICD-10-CM | POA: Diagnosis not present

## 2020-01-13 DIAGNOSIS — E559 Vitamin D deficiency, unspecified: Secondary | ICD-10-CM

## 2020-01-13 DIAGNOSIS — E785 Hyperlipidemia, unspecified: Secondary | ICD-10-CM

## 2020-01-13 DIAGNOSIS — N200 Calculus of kidney: Secondary | ICD-10-CM

## 2020-01-13 DIAGNOSIS — Z0001 Encounter for general adult medical examination with abnormal findings: Secondary | ICD-10-CM

## 2020-01-13 DIAGNOSIS — W57XXXA Bitten or stung by nonvenomous insect and other nonvenomous arthropods, initial encounter: Secondary | ICD-10-CM

## 2020-01-13 HISTORY — DX: Calculus of kidney: N20.0

## 2020-01-13 LAB — LIPID PANEL
Cholesterol: 208 mg/dL — ABNORMAL HIGH (ref 0–200)
HDL: 55.4 mg/dL (ref >39.00)
NonHDL: 153.04
Total CHOL/HDL Ratio: 4
Triglycerides: 207 mg/dL — ABNORMAL HIGH (ref 0.0–149.0)
VLDL: 41.4 mg/dL — ABNORMAL HIGH (ref 0.0–40.0)

## 2020-01-13 LAB — CBC WITH DIFFERENTIAL/PLATELET
Basophils Absolute: 0.1 10*3/uL (ref 0.0–0.1)
Basophils Relative: 1.7 % (ref 0.0–3.0)
Eosinophils Absolute: 0.1 10*3/uL (ref 0.0–0.7)
Eosinophils Relative: 2.7 % (ref 0.0–5.0)
HCT: 43.3 % (ref 36.0–46.0)
Hemoglobin: 14.5 g/dL (ref 12.0–15.0)
Lymphocytes Relative: 31.6 % (ref 12.0–46.0)
Lymphs Abs: 1.8 10*3/uL (ref 0.7–4.0)
MCHC: 33.5 g/dL (ref 30.0–36.0)
MCV: 90.1 fl (ref 78.0–100.0)
Monocytes Absolute: 0.3 10*3/uL (ref 0.1–1.0)
Monocytes Relative: 6.1 % (ref 3.0–12.0)
Neutro Abs: 3.2 10*3/uL (ref 1.4–7.7)
Neutrophils Relative %: 57.9 % (ref 43.0–77.0)
Platelets: 270 10*3/uL (ref 150.0–400.0)
RBC: 4.81 Mil/uL (ref 3.87–5.11)
RDW: 13.2 % (ref 11.5–15.5)
WBC: 5.6 10*3/uL (ref 4.0–10.5)

## 2020-01-13 LAB — BASIC METABOLIC PANEL
BUN: 13 mg/dL (ref 6–23)
CO2: 31 mEq/L (ref 19–32)
Calcium: 9.2 mg/dL (ref 8.4–10.5)
Chloride: 102 mEq/L (ref 96–112)
Creatinine, Ser: 0.71 mg/dL (ref 0.40–1.20)
GFR: 83.28 mL/min (ref >60.00)
Glucose, Bld: 102 mg/dL — ABNORMAL HIGH (ref 70–99)
Potassium: 3.9 mEq/L (ref 3.5–5.1)
Sodium: 138 mEq/L (ref 135–145)

## 2020-01-13 LAB — TSH: TSH: 1.84 u[IU]/mL (ref 0.35–4.50)

## 2020-01-13 LAB — VITAMIN B12: Vitamin B-12: 440 pg/mL (ref 211–911)

## 2020-01-13 LAB — HEPATIC FUNCTION PANEL
ALT: 18 U/L (ref 0–35)
AST: 20 U/L (ref 0–37)
Albumin: 4.5 g/dL (ref 3.5–5.2)
Alkaline Phosphatase: 53 U/L (ref 39–117)
Bilirubin, Direct: 0.2 mg/dL (ref 0.0–0.3)
Total Bilirubin: 1.1 mg/dL (ref 0.2–1.2)
Total Protein: 7.5 g/dL (ref 6.0–8.3)

## 2020-01-13 LAB — VITAMIN D 25 HYDROXY (VIT D DEFICIENCY, FRACTURES): VITD: 57.67 ng/mL (ref 30.00–100.00)

## 2020-01-13 LAB — LDL CHOLESTEROL, DIRECT: Direct LDL: 113 mg/dL

## 2020-01-13 MED ORDER — DOXYCYCLINE HYCLATE 100 MG PO TABS: 100.0000 mg | ORAL_TABLET | Freq: Two times a day (BID) | ORAL | 0 refills | Status: DC

## 2020-01-13 NOTE — Progress Notes (Signed)
Subjective:    Patient ID: Joanne Prince, female    DOB: August 26, 1956, 64 y.o.   MRN: [id number]  HPI  Here for wellness and f/u;  Overall doing ok;  Pt denies Chest pain, worsening SOB, DOE, wheezing, orthopnea, PND, worsening LE edema, palpitations, dizziness or syncope.  Pt denies neurological change such as new headache, facial or extremity weakness.  Pt denies polydipsia, polyuria, or low sugar symptoms. Pt states overall good compliance with treatment and medications, good tolerability, and has been trying to follow appropriate diet.  Pt denies worsening depressive symptoms, suicidal ideation or panic. No fever, night sweats, wt loss, loss of appetite, or other constitutional symptoms.  Pt states good ability with ADL's, has low fall risk, home safety reviewed and adequate, no other significant changes in hearing or vision, and only occasionally active with exercise. BP at home <140/90.  BP Readings from Last 3 Encounters:  01/13/20 (!) 190/109  04/29/19 118/76  04/08/19 130/80  Also with c/o tick bite noted may 2 and found may 3 and removed successfully, but now with worsening spreading rash circular nonpainful about 4 cm oval Past Medical History:  Diagnosis Date  . ALLERGIC RHINITIS 07/01/2007   Qualifier: Diagnosis of  By: Jenny Reichmann MD, Hunt Oris   . ASCUS of cervix with negative high risk HPV 03/2017, 04/2019  . [place] DISEASE, LUMBAR 07/01/2007   Qualifier: Diagnosis of  By: Jenny Reichmann MD, [place], HX OF 07/01/2007   Qualifier: Diagnosis of  By: Jenny Reichmann MD, Hunt Oris   . Heart murmur   . History of indigestion   . History of kidney stones   . HYPERLIPIDEMIA 07/26/2010   Qualifier: Diagnosis of  By: Jenny Reichmann MD, Hunt Oris   . Kidney stone 01/13/2020  . Kidney stones   . MITRAL VALVE PROLAPSE, HX OF 07/01/2007   Qualifier: Diagnosis of  By: Jenny Reichmann MD, Hunt Oris   . NEPHROLITHIASIS, HX OF 10/28/2007   Qualifier: Diagnosis of  By: Linda Hedges MD, Worthy Rancher, LOCAL NOS, SHOULDER  07/01/2007   Qualifier: Diagnosis of  By: Jenny Reichmann MD, Hunt Oris   . OSTEOPENIA 05/2019   T score -2.0 FRAX 9.8% / 1.3% stable from prior DEXA   Past Surgical History:  Procedure Laterality Date  . cholecystectomy    . CYSTOSCOPY WITH RETROGRADE PYELOGRAM, URETEROSCOPY AND STENT PLACEMENT Left 11/06/2018   Procedure: CYSTOSCOPY WITH RETROGRADE PYELOGRAM, URETEROSCOPY AND STENT PLACEMENT;  Surgeon: Alexis Frock, MD;  Location: [facility];  Service: Urology;  Laterality: Left;  . HOLMIUM LASER APPLICATION Left [id number]   Procedure: HOLMIUM LASER APPLICATION;  Surgeon: Alexis Frock, MD;  Location: [facility];  Service: Urology;  Laterality: Left;  . LASIX Bilateral   . uterine polypectomy    . WISDOM TOOTH EXTRACTION      reports that she has never smoked. She has never used smokeless tobacco. She reports current alcohol use. She reports that she does not use drugs. family history includes Celiac disease in her maternal uncle; Hypertension in her mother. No Known Allergies Current Outpatient Medications on File Prior to Visit  Medication Sig Dispense Refill  . APPLE CIDER VINEGAR PO Take by mouth.    . Calcium Carb-Cholecalciferol (CALTRATE 600+D3) 600-800 MG-UNIT TABS     . Cholecalciferol (VITAMIN D3 PO) Take by mouth.    . estradiol (VIVELLE-DOT) 0.05 MG/24HR patch Place 1 patch (0.05 mg total) onto the skin 2 (two) times a week.  24 patch 4  . progesterone (PROMETRIUM) 100 MG capsule take 1 capsule by mouth ONCE AT BEDTIME 30 capsule 6  . progesterone (PROMETRIUM) 100 MG capsule     . Specialty Vitamins Products (BIOTIN PLUS KERATIN) 10000-100 MCG-MG TABS Take by mouth.     No current facility-administered medications on file prior to visit.   Review of Systems All otherwise neg per pt     Objective:   Physical Exam BP (!) 190/109 (BP Location: Left Arm, Patient Position: Sitting, Cuff Size: Large)   Pulse 81   Temp 98 F (36.7 C) (Oral)   Ht 5'  4.5" (1.638 m)   Wt 143 lb (64.9 kg)   LMP 03/08/2005   SpO2 98%   BMI 24.17 kg/m ', VS noted,  Constitutional: Pt appears in NAD HENT: Head: NCAT.  Right Ear: External ear normal.  Left Ear: External ear normal.  Eyes: . Pupils are equal, round, and reactive to light. Conjunctivae and EOM are normal Nose: without d/c or deformity Neck: Neck supple. Gross normal ROM Cardiovascular: Normal rate and regular rhythm.   Pulmonary/Chest: Effort normal and breath sounds without rales or wheezing.  Abd:  Soft, NT, ND, + BS, no organomegaly Neurological: Pt is alert. At baseline orientation, motor grossly intact Skin: Skin is warm, no LE edema, left lateral hip area with 4 cm oval rash c/w possible erythema chronicum migrans Psychiatric: Pt behavior is normal without agitation  All otherwise neg per pt Lab Results  Component Value Date   WBC 5.6 01/13/2020   HGB 14.5 01/13/2020   HCT 43.3 01/13/2020   PLT 270.0 01/13/2020   GLUCOSE 102 (H) 01/13/2020   CHOL 208 (H) 01/13/2020   TRIG 207.0 (H) 01/13/2020   HDL 55.40 01/13/2020   LDLDIRECT 113.0 01/13/2020   LDLCALC 133 (H) 04/08/2019   ALT 18 01/13/2020   AST 20 01/13/2020   NA 138 01/13/2020   K 3.9 01/13/2020   CL 102 01/13/2020   CREATININE 0.71 01/13/2020   BUN 13 01/13/2020   CO2 31 01/13/2020   TSH 1.84 01/13/2020   HGBA1C 5.6 04/02/2016      Assessment & Plan:

## 2020-01-13 NOTE — Patient Instructions (Signed)
Please take all new medication as prescribed  - the antibiotic  Please continue all other medications as before, and refills have been done if requested.  Please have the pharmacy call with any other refills you may need.  Please continue your efforts at being more active, low cholesterol diet, and weight control.  You are otherwise up to date with prevention measures today.  Please keep your appointments with your specialists as you may have planned  Please go to the LAB at the blood drawing area for the tests to be done  You will be contacted by phone if any changes need to be made immediately.  Otherwise, you will receive a letter about your results with an explanation, but please check with MyChart first.  Please remember to sign up for MyChart if you have not done so, as this will be important to you in the future with finding out test results, communicating by private email, and scheduling acute appointments online when needed.  Please make an Appointment to return for your 1 year visit, or sooner if needed 

## 2020-01-14 LAB — HIV ANTIBODY (ROUTINE TESTING W REFLEX): HIV 1&2 Ab, 4th Generation: NONREACTIVE

## 2020-01-14 LAB — HEPATITIS C ANTIBODY
Hepatitis C Ab: NONREACTIVE
SIGNAL TO CUT-OFF: 0.01 (ref <1.00)

## 2020-01-15 ENCOUNTER — Encounter: Payer: Self-pay | Admitting: Internal Medicine

## 2020-01-15 DIAGNOSIS — W57XXXA Bitten or stung by nonvenomous insect and other nonvenomous arthropods, initial encounter: Secondary | ICD-10-CM | POA: Insufficient documentation

## 2020-01-15 LAB — HGE(IGG/M)+LYMEAB(IGM)+RKYIGM
HGE IgG Titer: NEGATIVE
HGE IgM Titer: NEGATIVE
LYME DISEASE AB, QUANT, IGM: <0.8 index (ref 0.00–0.79)
RMSF IgM: 0.13 index (ref 0.00–0.89)

## 2020-01-15 NOTE — Assessment & Plan Note (Signed)

## 2020-01-15 NOTE — Assessment & Plan Note (Signed)
Mild to mod, for antibx course,  to f/u any worsening symptoms or concerns, for lab serology as well  I spent 21 minutes in preparing to see the patient by review of recent labs, imaging and procedures, obtaining and reviewing separately obtained history, communicating with the patient and family or caregiver, ordering medications, tests or procedures, and documenting clinical information in the EHR including the differential Dx, treatment, and any further evaluation and other management of tick bite, hld

## 2020-01-18 ENCOUNTER — Ambulatory Visit: Admitting: Family Medicine

## 2020-01-21 ENCOUNTER — Ambulatory Visit: Payer: Self-pay

## 2020-01-21 ENCOUNTER — Encounter: Payer: Self-pay | Admitting: Family Medicine

## 2020-01-21 ENCOUNTER — Ambulatory Visit (INDEPENDENT_AMBULATORY_CARE_PROVIDER_SITE_OTHER): Admitting: Family Medicine

## 2020-01-21 ENCOUNTER — Other Ambulatory Visit: Payer: Self-pay

## 2020-01-21 VITALS — BP 140/82 | HR 97 | Ht 64.5 in | Wt 142.0 lb

## 2020-01-21 DIAGNOSIS — M25511 Pain in right shoulder: Secondary | ICD-10-CM | POA: Diagnosis not present

## 2020-01-21 DIAGNOSIS — M7702 Medial epicondylitis, left elbow: Secondary | ICD-10-CM | POA: Diagnosis not present

## 2020-01-21 NOTE — Patient Instructions (Signed)
Thank you for coming in today. Plan for PT.  If not improving with PT will plan for xray and possibly MRI or injection.  It is possible that there is a nerve component as well.   Recheck in 6 weeks.   For the elbow. Do the wrist stretch.  With elbow straight go from wrist flexed to wrist down position with weight slowly 30 reps 2x daily.  Ok to also use theraband flexbar.  Keep the elbow straight.

## 2020-01-21 NOTE — Progress Notes (Signed)
    Subjective:    CC: R shoulder pain  I, Molly Weber, LAT, ATC, am serving as scribe for Dr. Lynne Leader.  HPI: Pt is a 64 y/o female presenting w/ c/o R shoulder pain x several months w/ no known MOI.  She locates her pain to her R upper trap and post-lat shoulder and intermittently into her R anterior chest and describes her pain as tightness.  Radiating pain: yes from her R UT to her post-lat shoulder R shoulder mechanical symptoms: No Aggravating factors: horizontal aDd; funtional IR; end-range overhead AROM Treatments tried: heat, Blu Emu  Additionally she notes some left medial elbow pain ongoing for a few weeks.  She notes she had some golf balls and has elevated pain without along with grip and wrist flexion activities.  Pertinent review of Systems: No fevers or chills  Relevant historical information: Fibromyalgia history lateral epicondylitis right elbow.   Objective:    Vitals:   01/21/20 1324  BP: 140/82  Pulse: 97  SpO2: 99%   General: Well Developed, well nourished, and in no acute distress.   MSK:  C-spine normal-appearing Nontender midline.  Mildly tender palpation right trapezius. Decreased cervical motion lateral flexion otherwise normal. Upper extremity strength is intact. Sensation is intact distally.  Right shoulder normal-appearing Not particular tender palpation. Normal shoulder motion. Strength intact with mild pain with resisted external rotation otherwise normal. Negative Hawkins and Neer's test. Negative empty can test. Negative Yergason's and speeds test.  Left elbow normal-appearing tender palpation medial epicondyle mildly. Some pain with resisted wrist flexion.  Lab and Radiology Results  Diagnostic Limited MSK Ultrasound of: Right shoulder Biceps tendon intact in bicipital groove normal-appearing Subscapularis is intact with slight fleck of hyperechoic change at distal tendon insertion. Supraspinatus tendon is intact and  normal appearing Mild increased subacromial bursa thickness. Infraspinatus tendon normal-appearing AC joint mildly degenerative with mild effusion. Impression: Mild subscapularis calcific change indicating tendinopathy.  Mild subacromial bursitis. Mild AC DJD     Impression and Recommendations:    Assessment and Plan: 64 y.o. female with  Right shoulder pain.  Consistent with mild rotator cuff tendinopathy and periscapular muscle dysfunction and pain.  Remotely possible that some of her pain is due to T1 radiculopathy given location of her pain on her back and chest wall. Plan for physical therapy.  Recheck in 6 weeks if not better would proceed with x-ray shoulder and C-spine and possibly proceed with injection.  Left elbow pain medial epicondylitis.  Home exercise program taught in clinic today..   Orders Placed This Encounter  Procedures  . [place] LIMITED JOINT SPACE STRUCTURES UP RIGHT(NO LINKED CHARGES)    Order Specific Question:   Reason for Exam (SYMPTOM  OR DIAGNOSIS REQUIRED)    Answer:   R shoulder pain    Order Specific Question:   Preferred imaging location?    Answer:   [place]  . Ambulatory referral to Physical Therapy    Referral Priority:   Routine    Referral Type:   Physical Medicine    Referral Reason:   Specialty Services Required    Requested Specialty:   Physical Therapy   No orders of the defined types were placed in this encounter.   Discussed warning signs or symptoms. Please see discharge instructions. Patient expresses understanding.   The above documentation has been reviewed and is accurate and complete Lynne Leader, M.D.

## 2020-01-26 ENCOUNTER — Other Ambulatory Visit: Payer: Self-pay

## 2020-01-26 ENCOUNTER — Ambulatory Visit: Attending: Family Medicine | Admitting: Physical Therapy

## 2020-01-26 ENCOUNTER — Encounter: Payer: Self-pay | Admitting: Physical Therapy

## 2020-01-26 DIAGNOSIS — G8929 Other chronic pain: Secondary | ICD-10-CM | POA: Diagnosis present

## 2020-01-26 DIAGNOSIS — M25511 Pain in right shoulder: Secondary | ICD-10-CM | POA: Insufficient documentation

## 2020-01-26 DIAGNOSIS — M6281 Muscle weakness (generalized): Secondary | ICD-10-CM

## 2020-01-26 DIAGNOSIS — M25611 Stiffness of right shoulder, not elsewhere classified: Secondary | ICD-10-CM | POA: Diagnosis present

## 2020-01-26 NOTE — Patient Instructions (Signed)
Access Code: [email or web address] URL: [email or web address] Date: 01/26/2020 Prepared by: Hilda Blades  Exercises Shoulder External Rotation and Scapular Retraction with Resistance - 1 x daily - 7 x weekly - 2 sets - 15 reps Banded Row - 1 x daily - 7 x weekly - 2 sets - 20 reps Scapular Retraction with Resistance Advanced - 1 x daily - 7 x weekly - 2 sets - 20 reps Scaption with Dumbbells - 1 x daily - 7 x weekly - 2 sets - 15 reps

## 2020-01-27 ENCOUNTER — Other Ambulatory Visit: Payer: Self-pay

## 2020-01-27 NOTE — Therapy (Signed)
[place], [place], [address] Phone: [phone]   Fax:  [phone]  Physical Therapy Evaluation  Patient Details  Name: Joanne Prince MRN: [id number] Date of Birth: Jan 15, 1956 Referring Provider (PT): Gregor Hams, MD   Encounter Date: 01/26/2020  PT End of Session - 01/27/20 0817    Visit Number  1    Number of Visits  6    Date for PT Re-Evaluation  03/08/20    Authorization Type  TRICARE    PT Start Time  1400    PT Stop Time  1440    PT Time Calculation (min)  40 min    Activity Tolerance  Patient tolerated treatment well    Behavior During Therapy  [facility] for tasks assessed/performed       Past Medical History:  Diagnosis Date  . ALLERGIC RHINITIS 07/01/2007   Qualifier: Diagnosis of  By: Jenny Reichmann MD, Hunt Oris   . ASCUS of cervix with negative high risk HPV 03/2017, 04/2019  . [place] DISEASE, LUMBAR 07/01/2007   Qualifier: Diagnosis of  By: Jenny Reichmann MD, [place], HX OF 07/01/2007   Qualifier: Diagnosis of  By: Jenny Reichmann MD, Hunt Oris   . Heart murmur   . History of indigestion   . History of kidney stones   . HYPERLIPIDEMIA 07/26/2010   Qualifier: Diagnosis of  By: Jenny Reichmann MD, Hunt Oris   . Kidney stone 01/13/2020  . Kidney stones   . MITRAL VALVE PROLAPSE, HX OF 07/01/2007   Qualifier: Diagnosis of  By: Jenny Reichmann MD, Hunt Oris   . NEPHROLITHIASIS, HX OF 10/28/2007   Qualifier: Diagnosis of  By: Linda Hedges MD, Worthy Rancher, LOCAL NOS, SHOULDER 07/01/2007   Qualifier: Diagnosis of  By: Jenny Reichmann MD, Hunt Oris   . OSTEOPENIA 05/2019   T score -2.0 FRAX 9.8% / 1.3% stable from prior DEXA    Past Surgical History:  Procedure Laterality Date  . cholecystectomy    . CYSTOSCOPY WITH RETROGRADE PYELOGRAM, URETEROSCOPY AND STENT PLACEMENT Left 11/06/2018   Procedure: CYSTOSCOPY WITH RETROGRADE PYELOGRAM, URETEROSCOPY AND STENT PLACEMENT;  Surgeon: Alexis Frock, MD;  Location: [facility];  Service: Urology;  Laterality: Left;  . HOLMIUM LASER APPLICATION Left [id number]   Procedure: HOLMIUM LASER APPLICATION;  Surgeon: Alexis Frock, MD;  Location: [facility];  Service: Urology;  Laterality: Left;  . LASIX Bilateral   . uterine polypectomy    . WISDOM TOOTH EXTRACTION      There were no vitals filed for this visit.   Subjective Assessment - 01/26/20 1356    Subjective  Patient reports right shoulder pain for several months that started with tightness and now is bothering more on the outside of the shoulder. Sleeping on the right side bothers the shoulder. She states its not like pain but more tightness. She does feel like she has full range of motion. Doesn't remember a mechnism of injury, she did have a fall on that shoulder and she uses that arm to hold the door open at work but unsure if these caused the pain.    Pertinent History  Fibromyalgia    Limitations  Lifting;House hold activities    How long can you sit comfortably?  No limitation    Diagnostic tests  [facility] [place]    Patient Stated Goals  Improve right shoulder discomfort    Currently in Pain?  Yes    Pain  Score  0-No pain    Pain Location  Shoulder    Pain Orientation  Right    Pain Descriptors / Indicators  Tightness;Tender    Pain Type  Chronic pain    Pain Onset  More than a month ago    Pain Frequency  Intermittent    Aggravating Factors   Reaching behind her back, reaching across chest, raising arm overhead    Pain Relieving Factors  Hot water, rubbing the shoulder    Effect of Pain on Daily Activities  Patient denies limitation with activity, just more careful with activity         [facility] PT Assessment - 01/27/20 0001      Assessment   Medical Diagnosis  Right shoulder pain    Referring Provider (PT)  Gregor Hams, MD    Onset Date/Surgical Date  --   patient reports pain for several months   Hand Dominance  Left    Next MD Visit  03/03/2020    Prior Therapy  None       Precautions   Precautions  None      Restrictions   Weight Bearing Restrictions  No      Balance Screen   Has the patient fallen in the past 6 months  Yes    How many times?  [place] on right side a few months ago    Has the patient had a decrease in activity level because of a fear of falling?   No    Is the patient reluctant to leave their home because of a fear of falling?   No      Home Film/video editor residence      Prior Function   Level of Independence  Independent    Vocation  Full time employment    Vocation Requirements  Works at a school    Leisure  Golf - hasn't done that in over a year      Cognition   Overall Cognitive Status  Within Functional Limits for tasks assessed      Observation/Other Assessments   Observations  Patient appears in no apparent distress    Focus on Therapeutic Outcomes (FOTO)   NA - will assess next visit      Sensation   Light Touch  Appears Intact      Posture/Postural Control   Posture Comments  Patient exhibits mild rounded shoulder and forward head posture      ROM / Strength   AROM / PROM / Strength  AROM;PROM;Strength      AROM   Overall AROM Comments  All cervical AROM WFL and non-painful    AROM Assessment Site  Shoulder    Right/Left Shoulder  Right;Left    Right Shoulder Flexion  160 Degrees    Right Shoulder ABduction  170 Degrees    Right Shoulder Internal Rotation  --   T10   Right Shoulder External Rotation  60 Degrees   T2   Left Shoulder Flexion  160 Degrees    Left Shoulder ABduction  170 Degrees    Left Shoulder Internal Rotation  --   T8   Left Shoulder External Rotation  60 Degrees   T2     PROM   Overall PROM Comments  PROM grossly WFL, tightness reported at end range in all direction      Strength   Overall Strength Comments  Bilat periscapular strength grossly 4/5 MMT  Strength Assessment Site  Shoulder    Right/Left Shoulder  Right;Left    Right Shoulder Flexion  4+/5     Right Shoulder Extension  4+/5    Right Shoulder ABduction  5/5    Right Shoulder Internal Rotation  5/5    Right Shoulder External Rotation  4+/5    Right Shoulder Horizontal ABduction  4/5    Left Shoulder Flexion  5/5    Left Shoulder Extension  5/5    Left Shoulder ABduction  5/5    Left Shoulder Internal Rotation  5/5    Left Shoulder External Rotation  5/5    Left Shoulder Horizontal ABduction  5/5      Palpation   Palpation comment  TTP to lateral periacromial region, greater tubercle region, posterior cuff      Special Tests    Special Tests  Rotator Cuff Impingement    Rotator Cuff Impingment tests  Michel Bickers test      Hawkins-Kennedy test   Findings  Positive      Transfers   Transfers  Independent with all Transfers                  Objective measurements completed on examination: See above findings.      [facility] Adult PT Treatment/Exercise - 01/27/20 0001      Exercises   Exercises  Shoulder      Shoulder Exercises: Standing   External Rotation  15 reps    Theraband Level (Shoulder External Rotation)  Level 2 (Red)    External Rotation Limitations  Double ER with scap retraction    Extension  20 reps    Theraband Level (Shoulder Extension)  Level 3 (Green)    Row  20 reps    Theraband Level (Shoulder Row)  Level 3 (Green)    Shoulder Elevation  15 reps    Shoulder Elevation Limitations  Scaption with 2#             PT Education - 01/27/20 0817    Education Details  Exam findings, shoulder anatomy, POC, HEP    Person(s) Educated  Patient    Methods  Explanation;Demonstration;Tactile cues;Verbal cues;Handout    Comprehension  Verbalized understanding;Returned demonstration;Verbal cues required;Tactile cues required;Need further instruction       PT Short Term Goals - 01/27/20 0826      PT SHORT TERM GOAL #1   Title  Patient will be I with initial HEP to progress with PT    Time  4    Period  Weeks    Status  New     Target Date  02/23/20        PT Long Term Goals - 01/27/20 0826      PT LONG TERM GOAL #1   Title  Patient will be I with final HEP to maintain progress from PT    Time  6    Period  Weeks    Status  New    Target Date  03/08/20      PT LONG TERM GOAL #2   Title  Patient will report no difficulty or discomfort with dressing/putting on bra    Time  6    Period  Weeks    Status  New    Target Date  03/08/20      PT LONG TERM GOAL #3   Title  Patient will exhibit grossly 5/5 MMT right shoulder strength in order to improve pain and control with reaching and  lifting overhead    Time  6    Period  Weeks    Status  New    Target Date  03/08/20      PT LONG TERM GOAL #4   Title  Patient will exhibit full right shoulder AROM with tightness or discomfort to improve household tasks without limitation    Time  6    Period  Weeks    Status  New    Target Date  03/08/20             Plan - 01/27/20 0819    Clinical Impression Statement  Patient presents to PT with report of chronic right shoulder pain with no apparent mechanism of injury. Her current symptoms seem consistent with rotator cuff tendinopathy. She demonstrates good overall range of motion with limitation reaching behind back and discomfort at end ranges, strength deficit of the right rotator cuff, and positive impingement sings with testing. She was provided exercises to initiate rotator cuff and periscapular strengthening with good tolerance. She would benefit from continued skilled PT to progress right shoulder strength and motion in order to reduce pain and maximize functional level.    Personal Factors and Comorbidities  Time since onset of injury/illness/exacerbation;Comorbidity 1    Comorbidities  Fibromyalgia    Examination-Activity Limitations  Reach Overhead;Dressing;Lift    Examination-Participation Restrictions  Cleaning;Yard Work    Stability/Clinical Decision Making  Stable/Uncomplicated    Clinical  Decision Making  Low    Rehab Potential  Good    PT Frequency  1x / week    PT Duration  6 weeks    PT Treatment/Interventions  ADLs/Self Care Home Management;Cryotherapy;Electrical Stimulation;Iontophoresis 4mg /ml Dexamethasone;Moist Heat;Ultrasound;Neuromuscular re-education;Therapeutic exercise;Therapeutic activities;Patient/family education;Manual techniques;Dry needling;Passive range of motion;Taping;Joint Manipulations;Spinal Manipulations    PT Next Visit Plan  Assess HEP and progress PRM, manual and/or modalities for posterior cuff tightness and pain reduction, progress rotator cuff and periscapular strength    PT Home Exercise Plan  [email or web address]: double ER with red, row and extension with green, scaption with 2#    Consulted and Agree with Plan of Care  Patient       Patient will benefit from skilled therapeutic intervention in order to improve the following deficits and impairments:  Decreased range of motion, Postural dysfunction, Decreased strength, Pain  Visit Diagnosis: Chronic right shoulder pain  Stiffness of right shoulder, not elsewhere classified  Muscle weakness (generalized)     Problem List Patient Active Problem List   Diagnosis Date Noted  . Tick bite of hip, left, initial encounter 01/15/2020  . Encounter for well adult exam with abnormal findings 01/13/2020  . Kidney stone 01/13/2020  . Acute upper respiratory infection 10/09/2017  . Left hand pain 10/09/2017  . Eustachian tube dysfunction 10/22/2013  . Lateral epicondylitis of right elbow 10/22/2013  . HLD (hyperlipidemia) 07/26/2010  . OSTEOPENIA 07/25/2010  . FIBROMYALGIA 10/24/2009  . THYROID MASS 08/07/2009  . VARICOSE VEINS, LOWER EXTREMITIES 07/25/2009  . PARESTHESIA 12/01/2008  . NEPHROLITHIASIS, HX OF 10/28/2007  . 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 [place] DISEASE, LUMBAR 07/01/2007  . MITRAL VALVE PROLAPSE, HX OF 07/01/2007  . GENITAL HERPES, HX OF 07/01/2007    Hilda Blades, PT, DPT, LAT, ATC 01/27/20  9:09 AM Phone: [phone] Fax: [facility] [address] [place], [place], [address] Phone: [phone]   Fax:  [phone]  Name: Joanne Prince MRN: [id number] Date of Birth: 09-05-1955

## 2020-02-08 ENCOUNTER — Other Ambulatory Visit: Payer: Self-pay

## 2020-02-08 ENCOUNTER — Ambulatory Visit: Attending: Family Medicine | Admitting: Physical Therapy

## 2020-02-08 ENCOUNTER — Encounter: Payer: Self-pay | Admitting: Physical Therapy

## 2020-02-08 DIAGNOSIS — M25511 Pain in right shoulder: Secondary | ICD-10-CM | POA: Diagnosis present

## 2020-02-08 DIAGNOSIS — M6281 Muscle weakness (generalized): Secondary | ICD-10-CM | POA: Insufficient documentation

## 2020-02-08 DIAGNOSIS — M25611 Stiffness of right shoulder, not elsewhere classified: Secondary | ICD-10-CM | POA: Insufficient documentation

## 2020-02-08 DIAGNOSIS — G8929 Other chronic pain: Secondary | ICD-10-CM | POA: Diagnosis present

## 2020-02-08 NOTE — Patient Instructions (Signed)
Access Code: [email or web address] URL: [email or web address] Date: 02/08/2020 Prepared by: Joanne Prince [facility] [place]  Exercises Shoulder External Rotation and Scapular Retraction with Resistance - 1 x daily - 7 x weekly - 2 sets - 15 reps Banded Row - 1 x daily - 7 x weekly - 2 sets - 20 reps Scaption with Dumbbells - 1 x daily - 7 x weekly - 2 sets - 15 reps Low Trap Setting at [place] - 1 x daily - 7 x weekly - 2 sets - 15 reps Latissimus Dorsi Stretch at Wall - 1 x daily - 7 x weekly - 2 sets - 30 sec hold Standing Shoulder Internal Rotation Stretch with Towel - 1 x daily - 7 x weekly - 2 sets - 30 sec hold

## 2020-02-08 NOTE — Therapy (Signed)
[place], [place], [address] Phone: [phone]   Fax:  [phone]  Physical Therapy Treatment  Patient Details  Name: Joanne Prince MRN: [id number] Date of Birth: 1955/12/19 Referring Provider (PT): Gregor Hams, MD   Encounter Date: 02/08/2020  PT End of Session - 02/08/20 1616    Visit Number  2    Number of Visits  6    Date for PT Re-Evaluation  03/08/20    Authorization Type  TRICARE    PT Start Time  1530    PT Stop Time  1608    PT Time Calculation (min)  38 min    Activity Tolerance  Patient tolerated treatment well    Behavior During Therapy  [facility] for tasks assessed/performed       Past Medical History:  Diagnosis Date  . ALLERGIC RHINITIS 07/01/2007   Qualifier: Diagnosis of  By: Jenny Reichmann MD, Hunt Oris   . ASCUS of cervix with negative high risk HPV 03/2017, 04/2019  . [place] DISEASE, LUMBAR 07/01/2007   Qualifier: Diagnosis of  By: Jenny Reichmann MD, [place], HX OF 07/01/2007   Qualifier: Diagnosis of  By: Jenny Reichmann MD, Hunt Oris   . Heart murmur   . History of indigestion   . History of kidney stones   . HYPERLIPIDEMIA 07/26/2010   Qualifier: Diagnosis of  By: Jenny Reichmann MD, Hunt Oris   . Kidney stone 01/13/2020  . Kidney stones   . MITRAL VALVE PROLAPSE, HX OF 07/01/2007   Qualifier: Diagnosis of  By: Jenny Reichmann MD, Hunt Oris   . NEPHROLITHIASIS, HX OF 10/28/2007   Qualifier: Diagnosis of  By: Linda Hedges MD, Worthy Rancher, LOCAL NOS, SHOULDER 07/01/2007   Qualifier: Diagnosis of  By: Jenny Reichmann MD, Hunt Oris   . OSTEOPENIA 05/2019   T score -2.0 FRAX 9.8% / 1.3% stable from prior DEXA    Past Surgical History:  Procedure Laterality Date  . cholecystectomy    . CYSTOSCOPY WITH RETROGRADE PYELOGRAM, URETEROSCOPY AND STENT PLACEMENT Left 11/06/2018   Procedure: CYSTOSCOPY WITH RETROGRADE PYELOGRAM, URETEROSCOPY AND STENT PLACEMENT;  Surgeon: Alexis Frock, MD;  Location: [facility];  Service: Urology;  Laterality: Left;  . HOLMIUM LASER APPLICATION Left 11/05/5730   Procedure: HOLMIUM LASER APPLICATION;  Surgeon: Alexis Frock, MD;  Location: [facility];  Service: Urology;  Laterality: Left;  . LASIX Bilateral   . uterine polypectomy    . WISDOM TOOTH EXTRACTION      There were no vitals filed for this visit.  Subjective Assessment - 02/08/20 1524    Subjective  Pt states she has been doing the exercises twice a day. Pain on top of the shoulder at night which wakes her up (reports this is new). No other new issues noted. Reports tenderness in the same area. Pt states that after she performs exercises she feels it's not as tight.    Pertinent History  Fibromyalgia    Limitations  Lifting;House hold activities    How long can you sit comfortably?  No limitation    Diagnostic tests  [facility] [place]    Patient Stated Goals  Improve right shoulder discomfort    Currently in Pain?  No/denies    Pain Onset  More than a month ago                        [facility] Adult PT  Treatment/Exercise - 02/08/20 0001      Shoulder Exercises: Seated   Other Seated Exercises  UBE level 1.2 x 5 min      Shoulder Exercises: Standing   Row  10 reps    Theraband Level (Shoulder Row)  Level 3 (Green)   Modifed to stop full shoulder extension and cueing for scap   Retraction  Strengthening;5 reps;Both    Other Standing Exercises  Low trap wall setting x 10 reps      Shoulder Exercises: Stretch   Internal Rotation Stretch  30 seconds   with towel   Other Shoulder Stretches  Lat stretch against wall 2 sets x 30 sec    Other Shoulder Stretches  Lat stretch in supine with contract/relax x 5 reps      Manual Therapy   Manual Therapy  Soft tissue mobilization;Joint mobilization;Myofascial release    Joint Mobilization  Grade II to III GH posterior glide    Soft tissue mobilization  Infraspinatus, teres minor, teres major, and lat    Myofascial Release   trigger point release infraspinatus & teres             PT Education - 02/08/20 1956    Education Details  Modified HEP with addition of new stretching and strengthening exercises. Discussed using tennis ball for trigger point release as needed at home.    Person(s) Educated  Patient    Methods  Explanation;Demonstration;Tactile cues;Verbal cues;Handout    Comprehension  Verbalized understanding;Returned demonstration;Verbal cues required;Tactile cues required       PT Short Term Goals - 01/27/20 0826      PT SHORT TERM GOAL #1   Title  Patient will be I with initial HEP to progress with PT    Time  4    Period  Weeks    Status  New    Target Date  02/23/20        PT Long Term Goals - 01/27/20 0826      PT LONG TERM GOAL #1   Title  Patient will be I with final HEP to maintain progress from PT    Time  6    Period  Weeks    Status  New    Target Date  03/08/20      PT LONG TERM GOAL #2   Title  Patient will report no difficulty or discomfort with dressing/putting on bra    Time  6    Period  Weeks    Status  New    Target Date  03/08/20      PT LONG TERM GOAL #3   Title  Patient will exhibit grossly 5/5 MMT right shoulder strength in order to improve pain and control with reaching and lifting overhead    Time  6    Period  Weeks    Status  New    Target Date  03/08/20      PT LONG TERM GOAL #4   Title  Patient will exhibit full right shoulder AROM with tightness or discomfort to improve household tasks without limitation    Time  6    Period  Weeks    Status  New    Target Date  03/08/20            Plan - 02/08/20 1958    Clinical Impression Statement  Pt found to have trigger points along infraspinatus, lats and teres minor and major this session with report of feeling of tightness during  her current HEP. Treatment focused on addressing pt's tightness with manual therapy for trigger point release and soft tissue, stretching, and modifying HEP to  reduce pt's posterior shoulder tightness and increasing scapular mobility/strength. Introduced pt to using tennis ball for trigger point release for home.    Personal Factors and Comorbidities  Time since onset of injury/illness/exacerbation;Comorbidity 1    Comorbidities  Fibromyalgia    Examination-Activity Limitations  Reach Overhead;Dressing;Lift    Examination-Participation Restrictions  Cleaning;Yard Work    Stability/Clinical Decision Making  Stable/Uncomplicated    Clinical Decision Making  Low    Rehab Potential  Good    PT Frequency  1x / week    PT Duration  6 weeks    PT Treatment/Interventions  ADLs/Self Care Home Management;Cryotherapy;Electrical Stimulation;Iontophoresis 4mg /ml Dexamethasone;Moist Heat;Ultrasound;Neuromuscular re-education;Therapeutic exercise;Therapeutic activities;Patient/family education;Manual techniques;Dry needling;Passive range of motion;Taping;Joint Manipulations;Spinal Manipulations    PT Next Visit Plan  Assess HEP, manual and/or modalities for posterior cuff tightness and pain reduction, progress rotator cuff and periscapular strength. Progress and reinforce posture education    PT Home Exercise Plan  [email or web address]    Consulted and Agree with Plan of Care  Patient       Patient will benefit from skilled therapeutic intervention in order to improve the following deficits and impairments:  Decreased range of motion, Postural dysfunction, Decreased strength, Pain  Visit Diagnosis: Chronic right shoulder pain  Stiffness of right shoulder, not elsewhere classified  Muscle weakness (generalized)     Problem List Patient Active Problem List   Diagnosis Date Noted  . Tick bite of hip, left, initial encounter 01/15/2020  . Encounter for well adult exam with abnormal findings 01/13/2020  . Kidney stone 01/13/2020  . Acute upper respiratory infection 10/09/2017  . Left hand pain 10/09/2017  . Eustachian tube dysfunction 10/22/2013  . Lateral  epicondylitis of right elbow 10/22/2013  . HLD (hyperlipidemia) 07/26/2010  . OSTEOPENIA 07/25/2010  . FIBROMYALGIA 10/24/2009  . THYROID MASS 08/07/2009  . VARICOSE VEINS, LOWER EXTREMITIES 07/25/2009  . PARESTHESIA 12/01/2008  . NEPHROLITHIASIS, HX OF 10/28/2007  . 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 [place] DISEASE, LUMBAR 07/01/2007  . MITRAL VALVE PROLAPSE, HX OF 07/01/2007  . GENITAL HERPES, HX OF 07/01/2007    [facility] April Ma L Iyonnah Ferrante PT, DPT 02/08/2020, 8:11 PM  [facility] [address] [place], [place], [address] Phone: [phone]   Fax:  [phone]  Name: Joanne Prince MRN: [id number] Date of Birth: [birth year]

## 2020-02-10 ENCOUNTER — Other Ambulatory Visit: Payer: Self-pay

## 2020-02-10 ENCOUNTER — Encounter: Payer: Self-pay | Admitting: Physical Therapy

## 2020-02-10 ENCOUNTER — Ambulatory Visit: Admitting: Physical Therapy

## 2020-02-10 DIAGNOSIS — M25611 Stiffness of right shoulder, not elsewhere classified: Secondary | ICD-10-CM

## 2020-02-10 DIAGNOSIS — M25511 Pain in right shoulder: Secondary | ICD-10-CM | POA: Diagnosis not present

## 2020-02-10 DIAGNOSIS — M6281 Muscle weakness (generalized): Secondary | ICD-10-CM

## 2020-02-10 DIAGNOSIS — G8929 Other chronic pain: Secondary | ICD-10-CM

## 2020-02-10 NOTE — Therapy (Signed)
Joanne Prince, [place], [address] Phone: [phone]   Fax:  [phone]  Physical Therapy Treatment  Patient Details  Name: Joanne Prince MRN: [id number] Date of Birth: November 09, 1955 Referring Provider (PT): Gregor Hams, MD   Encounter Date: 02/10/2020   PT End of Session - 02/10/20 1621    Visit Number 3    Number of Visits 6    Date for PT Re-Evaluation 03/08/20    Authorization Type TRICARE    PT Start Time 2458    PT Stop Time 1613    PT Time Calculation (min) 42 min    Activity Tolerance Patient tolerated treatment well    Behavior During Therapy [facility] for tasks assessed/performed           Past Medical History:  Diagnosis Date  . ALLERGIC RHINITIS 07/01/2007   Qualifier: Diagnosis of  By: Jenny Reichmann MD, Hunt Oris   . ASCUS of cervix with negative high risk HPV 03/2017, 04/2019  . [place] DISEASE, LUMBAR 07/01/2007   Qualifier: Diagnosis of  By: Jenny Reichmann MD, [place], HX OF 07/01/2007   Qualifier: Diagnosis of  By: Jenny Reichmann MD, Hunt Oris   . Heart murmur   . History of indigestion   . History of kidney stones   . HYPERLIPIDEMIA 07/26/2010   Qualifier: Diagnosis of  By: Jenny Reichmann MD, Hunt Oris   . Kidney stone 01/13/2020  . Kidney stones   . MITRAL VALVE PROLAPSE, HX OF 07/01/2007   Qualifier: Diagnosis of  By: Jenny Reichmann MD, Hunt Oris   . NEPHROLITHIASIS, HX OF 10/28/2007   Qualifier: Diagnosis of  By: Linda Hedges MD, Worthy Rancher, LOCAL NOS, SHOULDER 07/01/2007   Qualifier: Diagnosis of  By: Jenny Reichmann MD, Hunt Oris   . OSTEOPENIA 05/2019   T score -2.0 FRAX 9.8% / 1.3% stable from prior DEXA    Past Surgical History:  Procedure Laterality Date  . cholecystectomy    . CYSTOSCOPY WITH RETROGRADE PYELOGRAM, URETEROSCOPY AND STENT PLACEMENT Left 11/06/2018   Procedure: CYSTOSCOPY WITH RETROGRADE PYELOGRAM, URETEROSCOPY AND STENT PLACEMENT;  Surgeon: Alexis Frock, MD;  Location: [facility];   Service: Urology;  Laterality: Left;  . HOLMIUM LASER APPLICATION Left 0/05/9832   Procedure: HOLMIUM LASER APPLICATION;  Surgeon: Alexis Frock, MD;  Location: [facility];  Service: Urology;  Laterality: Left;  . LASIX Bilateral   . uterine polypectomy    . WISDOM TOOTH EXTRACTION      There were no vitals filed for this visit.   Subjective Assessment - 02/10/20 1532    Subjective Pt states the exercises have been good. Pt reports that the tightness on her posterior shoulder feels like it's not quite there yet. Pt does note soreness. Pt reports that she has been doing the tennis ball and can still feel the knot in the back of her shoulder.    Pertinent History Fibromyalgia    Limitations Lifting;House hold activities    How long can you sit comfortably? No limitation    Diagnostic tests [facility] [place]    Patient Stated Goals Improve right shoulder discomfort    Currently in Pain? No/denies    Pain Score 0-No pain    Pain Orientation Right    Pain Onset More than a month ago  [place] Adult PT Treatment/Exercise - 02/10/20 0001      Shoulder Exercises: Seated   Other Seated Exercises UBE level 2 x 5 min      Shoulder Exercises: Sidelying   Other Sidelying Exercises Sleeper stretch 3 sets x 60 sec      Shoulder Exercises: Standing   Other Standing Exercises wall angel x 10 reps    Other Standing Exercises Scapula roll back against wall x 10 reps      Shoulder Exercises: Stretch   Internal Rotation Stretch 30 seconds   in prone with manual assist   Other Shoulder Stretches Lat stretch in supine with arms above head, feet against wall 3 sets x 30 sec   cueing to bring elbows in x10 reps     Manual Therapy   Joint Mobilization Grade II to III GH posterior and inferior glide    Soft tissue mobilization Infraspinatus, teres minor, teres major, and lat                    PT Short Term Goals - 01/27/20 7867      PT  SHORT TERM GOAL #1   Title Patient will be I with initial HEP to progress with PT    Time 4    Period Weeks    Status New    Target Date 02/23/20             PT Long Term Goals - 01/27/20 0826      PT LONG TERM GOAL #1   Title Patient will be I with final HEP to maintain progress from PT    Time 6    Period Weeks    Status New    Target Date 03/08/20      PT LONG TERM GOAL #2   Title Patient will report no difficulty or discomfort with dressing/putting on bra    Time 6    Period Weeks    Status New    Target Date 03/08/20      PT LONG TERM GOAL #3   Title Patient will exhibit grossly 5/5 MMT right shoulder strength in order to improve pain and control with reaching and lifting overhead    Time 6    Period Weeks    Status New    Target Date 03/08/20      PT LONG TERM GOAL #4   Title Patient will exhibit full right shoulder AROM with tightness or discomfort to improve household tasks without limitation    Time 6    Period Weeks    Status New    Target Date 03/08/20                 Plan - 02/10/20 1622    Clinical Impression Statement Pt reports some continued trigger points along her R posterior shoulder when using tennis ball at home. Treatment focused on continuing to improve GHJ tightness in posterior and inferior capsule, posterior cuff and lat tightness, and continuing to progress scapulothoracic strengthening.    Personal Factors and Comorbidities Time since onset of injury/illness/exacerbation;Comorbidity 1    Comorbidities Fibromyalgia    Examination-Activity Limitations Reach Overhead;Dressing;Lift    Examination-Participation Restrictions Cleaning;Yard Work    Stability/Clinical Decision Making Stable/Uncomplicated    Clinical Decision Making Low    Rehab Potential Good    PT Frequency 1x / week    PT Duration 6 weeks    PT Treatment/Interventions ADLs/Self Care Home Management;Cryotherapy;Electrical Stimulation;Iontophoresis 4mg /ml  Dexamethasone;Moist Heat;Ultrasound;Neuromuscular re-education;Therapeutic exercise;Therapeutic  activities;Patient/family education;Manual techniques;Dry needling;Passive range of motion;Taping;Joint Manipulations;Spinal Manipulations    PT Next Visit Plan Assess HEP, manual and/or modalities for posterior and inferior GHJ and posterior cuff tightness and pain reduction, progress rotator cuff and periscapular strength. Progress and reinforce posture education    PT Home Exercise Plan [id number]    Consulted and Agree with Plan of Care Patient           Patient will benefit from skilled therapeutic intervention in order to improve the following deficits and impairments:  Decreased range of motion, Postural dysfunction, Decreased strength, Pain  Visit Diagnosis: Chronic right shoulder pain  Stiffness of right shoulder, not elsewhere classified  Muscle weakness (generalized)     Problem List Patient Active Problem List   Diagnosis Date Noted  . Tick bite of hip, left, initial encounter 01/15/2020  . Encounter for well adult exam with abnormal findings 01/13/2020  . Kidney stone 01/13/2020  . Acute upper respiratory infection 10/09/2017  . Left hand pain 10/09/2017  . Eustachian tube dysfunction 10/22/2013  . Lateral epicondylitis of right elbow 10/22/2013  . HLD (hyperlipidemia) 07/26/2010  . OSTEOPENIA 07/25/2010  . FIBROMYALGIA 10/24/2009  . THYROID MASS 08/07/2009  . VARICOSE VEINS, LOWER EXTREMITIES 07/25/2009  . PARESTHESIA 12/01/2008  . NEPHROLITHIASIS, HX OF 10/28/2007  . 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 [place] DISEASE, LUMBAR 07/01/2007  . MITRAL VALVE PROLAPSE, HX OF 07/01/2007  . GENITAL HERPES, HX OF 07/01/2007    [facility] April Ma L Olumide Dolinger PT, DPT 02/10/2020, 4:32 PM  [facility] [address] [place], [place], [address] Phone: [phone]   Fax:  [phone]  Name: RHEANNON CERNEY MRN:  [id number] Date of Birth: 02-Jan-1956

## 2020-02-21 ENCOUNTER — Other Ambulatory Visit: Payer: Self-pay

## 2020-02-21 ENCOUNTER — Ambulatory Visit: Admitting: Physical Therapy

## 2020-02-21 ENCOUNTER — Encounter: Payer: Self-pay | Admitting: Physical Therapy

## 2020-02-21 DIAGNOSIS — M25511 Pain in right shoulder: Secondary | ICD-10-CM | POA: Diagnosis not present

## 2020-02-21 DIAGNOSIS — M25611 Stiffness of right shoulder, not elsewhere classified: Secondary | ICD-10-CM

## 2020-02-21 DIAGNOSIS — M6281 Muscle weakness (generalized): Secondary | ICD-10-CM

## 2020-02-21 DIAGNOSIS — G8929 Other chronic pain: Secondary | ICD-10-CM

## 2020-02-21 NOTE — Therapy (Signed)
[place], [place], [address] Phone: [phone]   Fax:  [phone]  Physical Therapy Treatment  Patient Details  Name: Joanne Prince MRN: [id number] Date of Birth: 1955/11/15 Referring Provider (PT): Gregor Hams, MD   Encounter Date: 02/21/2020   PT End of Session - 02/21/20 1610    Visit Number 4    Number of Visits 6    Date for PT Re-Evaluation 03/08/20    Authorization Type TRICARE    PT Start Time 1615    PT Stop Time 1655    PT Time Calculation (min) 40 min    Activity Tolerance Patient tolerated treatment well    Behavior During Therapy [facility] for tasks assessed/performed           Past Medical History:  Diagnosis Date  . ALLERGIC RHINITIS 07/01/2007   Qualifier: Diagnosis of  By: Jenny Reichmann MD, Hunt Oris   . ASCUS of cervix with negative high risk HPV 03/2017, 04/2019  . [place] DISEASE, LUMBAR 07/01/2007   Qualifier: Diagnosis of  By: Jenny Reichmann MD, [place], HX OF 07/01/2007   Qualifier: Diagnosis of  By: Jenny Reichmann MD, Hunt Oris   . Heart murmur   . History of indigestion   . History of kidney stones   . HYPERLIPIDEMIA 07/26/2010   Qualifier: Diagnosis of  By: Jenny Reichmann MD, Hunt Oris   . Kidney stone 01/13/2020  . Kidney stones   . MITRAL VALVE PROLAPSE, HX OF 07/01/2007   Qualifier: Diagnosis of  By: Jenny Reichmann MD, Hunt Oris   . NEPHROLITHIASIS, HX OF 10/28/2007   Qualifier: Diagnosis of  By: Linda Hedges MD, Worthy Rancher, LOCAL NOS, SHOULDER 07/01/2007   Qualifier: Diagnosis of  By: Jenny Reichmann MD, Hunt Oris   . OSTEOPENIA 05/2019   T score -2.0 FRAX 9.8% / 1.3% stable from prior DEXA    Past Surgical History:  Procedure Laterality Date  . cholecystectomy    . CYSTOSCOPY WITH RETROGRADE PYELOGRAM, URETEROSCOPY AND STENT PLACEMENT Left 11/06/2018   Procedure: CYSTOSCOPY WITH RETROGRADE PYELOGRAM, URETEROSCOPY AND STENT PLACEMENT;  Surgeon: Alexis Frock, MD;  Location: [facility];   Service: Urology;  Laterality: Left;  . HOLMIUM LASER APPLICATION Left 04/06/6313   Procedure: HOLMIUM LASER APPLICATION;  Surgeon: Alexis Frock, MD;  Location: [facility];  Service: Urology;  Laterality: Left;  . LASIX Bilateral   . uterine polypectomy    . WISDOM TOOTH EXTRACTION      There were no vitals filed for this visit.   Subjective Assessment - 02/21/20 1612    Subjective Patient reports she is doing well and is continuing to improve. Exercises are going well.    Patient Stated Goals Improve right shoulder discomfort    Currently in Pain? No/denies    Pain Score 0-No pain              [facility] PT Assessment - 02/21/20 0001      Assessment   Medical Diagnosis Right shoulder pain    Referring Provider (PT) Gregor Hams, MD      AROM   Right Shoulder Flexion 170 Degrees    Right Shoulder Internal Rotation --   T7   Left Shoulder Flexion 170 Degrees    Left Shoulder Internal Rotation --   T8  [place] Adult PT Treatment/Exercise - 02/21/20 0001      Exercises   Exercises Shoulder      Shoulder Exercises: Standing   Extension 20 reps   2 sets   Theraband Level (Shoulder Extension) Level 3 (Green)    Row 20 reps   2 sets   Theraband Level (Shoulder Row) Level 4 (Blue)    Other Standing Exercises Wall push-up plus 2x10    Other Standing Exercises Wall slide low trap with lift off x10      Shoulder Exercises: ROM/Strengthening   UBE (Upper Arm Bike) L1 x 6 min (2 fwd/bwd)      Manual Therapy   Manual Therapy Joint mobilization;Passive ROM    Joint Mobilization GHJ mobs grade III-IV inferior/posterior, MWM for IR behind back, MWM for end range flexion with hands on wall    Passive ROM Scap pinned cross body and lat stretch                  PT Education - 02/21/20 1609    Education Details HEP    Person(s) Educated Patient    Methods Explanation;Demonstration;Verbal cues    Comprehension Verbalized  understanding;Returned demonstration;Verbal cues required;Need further instruction            PT Short Term Goals - 02/21/20 1653      PT SHORT TERM GOAL #1   Title Patient will be I with initial HEP to progress with PT    Time 4    Period Weeks    Status On-going    Target Date 02/23/20             PT Long Term Goals - 01/27/20 0826      PT LONG TERM GOAL #1   Title Patient will be I with final HEP to maintain progress from PT    Time 6    Period Weeks    Status New    Target Date 03/08/20      PT LONG TERM GOAL #2   Title Patient will report no difficulty or discomfort with dressing/putting on bra    Time 6    Period Weeks    Status New    Target Date 03/08/20      PT LONG TERM GOAL #3   Title Patient will exhibit grossly 5/5 MMT right shoulder strength in order to improve pain and control with reaching and lifting overhead    Time 6    Period Weeks    Status New    Target Date 03/08/20      PT LONG TERM GOAL #4   Title Patient will exhibit full right shoulder AROM with tightness or discomfort to improve household tasks without limitation    Time 6    Period Weeks    Status New    Target Date 03/08/20                 Plan - 02/21/20 1610    Clinical Impression Statement Patient tolerated therapy well with no adverse effects. She continues to report mild posterior shoulder tightness with reaching behind back and end range flexion. Therapy primarily focused on manual and stretching to improve posterior shoulder mobility and patient reported improved following MWM for IR behind back and end range flexion. No change to HEP but was encouraged to continue consistency for improvement. Patient would benefit from continued skilled PT to progress shoulder mobility and reduce pain reaching behind back.    PT Treatment/Interventions ADLs/Self Care Home  Management;Cryotherapy;Electrical Stimulation;Iontophoresis 4mg /ml Dexamethasone;Moist  Heat;Ultrasound;Neuromuscular re-education;Therapeutic exercise;Therapeutic activities;Patient/family education;Manual techniques;Dry needling;Passive range of motion;Taping;Joint Manipulations;Spinal Manipulations    PT Next Visit Plan Assess HEP, manual and/or modalities for posterior and inferior GHJ and posterior cuff tightness and pain reduction, progress rotator cuff and periscapular strength. Progress and reinforce posture education    PT Home Exercise Plan [id number]    Consulted and Agree with Plan of Care Patient           Patient will benefit from skilled therapeutic intervention in order to improve the following deficits and impairments:  Decreased range of motion, Postural dysfunction, Decreased strength, Pain  Visit Diagnosis: Chronic right shoulder pain  Stiffness of right shoulder, not elsewhere classified  Muscle weakness (generalized)     Problem List Patient Active Problem List   Diagnosis Date Noted  . Tick bite of hip, left, initial encounter 01/15/2020  . Encounter for well adult exam with abnormal findings 01/13/2020  . Kidney stone 01/13/2020  . Acute upper respiratory infection 10/09/2017  . Left hand pain 10/09/2017  . Eustachian tube dysfunction 10/22/2013  . Lateral epicondylitis of right elbow 10/22/2013  . HLD (hyperlipidemia) 07/26/2010  . OSTEOPENIA 07/25/2010  . FIBROMYALGIA 10/24/2009  . THYROID MASS 08/07/2009  . VARICOSE VEINS, LOWER EXTREMITIES 07/25/2009  . PARESTHESIA 12/01/2008  . NEPHROLITHIASIS, HX OF 10/28/2007  . 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 [place] DISEASE, LUMBAR 07/01/2007  . MITRAL VALVE PROLAPSE, HX OF 07/01/2007  . GENITAL HERPES, HX OF 07/01/2007    Hilda Blades, PT, DPT, LAT, ATC 02/21/20  4:58 PM Phone: [phone] Fax: [facility] [address] [place], [place], [address] Phone: [phone]   Fax:  [phone]  Name: AARINI SLEE MRN: [id number] Date of Birth: 01-Nov-1955

## 2020-02-28 ENCOUNTER — Encounter: Admitting: Physical Therapy

## 2020-02-28 ENCOUNTER — Ambulatory Visit: Admitting: Physical Therapy

## 2020-02-28 ENCOUNTER — Other Ambulatory Visit: Payer: Self-pay

## 2020-02-28 DIAGNOSIS — M25611 Stiffness of right shoulder, not elsewhere classified: Secondary | ICD-10-CM

## 2020-02-28 DIAGNOSIS — M25511 Pain in right shoulder: Secondary | ICD-10-CM | POA: Diagnosis not present

## 2020-02-28 DIAGNOSIS — G8929 Other chronic pain: Secondary | ICD-10-CM

## 2020-02-28 DIAGNOSIS — M6281 Muscle weakness (generalized): Secondary | ICD-10-CM

## 2020-02-28 NOTE — Therapy (Signed)
[place], [place], [address] Phone: [phone]   Fax:  [phone]  Physical Therapy Treatment and Discharge  No action required; Joanne Prince has met PT goals and is d/cing from therapy.   Patient Details  Name: Joanne Prince MRN: [id number] Date of Birth: [birth year] Referring Provider (PT): Gregor Hams, MD   Encounter Date: 02/28/2020   PT End of Session - 02/28/20 1717    Visit Number 5    Number of Visits 6    Date for PT Re-Evaluation 03/08/20    Authorization Type TRICARE    PT Start Time 1700    PT Stop Time 5364    PT Time Calculation (min) 15 min    Activity Tolerance Patient tolerated treatment well    Behavior During Therapy [facility] for tasks assessed/performed           Past Medical History:  Diagnosis Date   ALLERGIC RHINITIS 07/01/2007   Qualifier: Diagnosis of  By: Jenny Reichmann MD, Hunt Oris    ASCUS of cervix with negative high risk HPV 03/2017, 04/2019   [place] DISEASE, LUMBAR 07/01/2007   Qualifier: Diagnosis of  By: Jenny Reichmann MD, [place], HX OF 07/01/2007   Qualifier: Diagnosis of  By: Jenny Reichmann MD, Hunt Oris    Heart murmur    History of indigestion    History of kidney stones    HYPERLIPIDEMIA 07/26/2010   Qualifier: Diagnosis of  By: Jenny Reichmann MD, Hunt Oris    Kidney stone 01/13/2020   Kidney stones    MITRAL VALVE PROLAPSE, HX OF 07/01/2007   Qualifier: Diagnosis of  By: Jenny Reichmann MD, [place], HX OF 10/28/2007   Qualifier: Diagnosis of  By: Linda Hedges MD, Worthy Rancher, LOCAL NOS, SHOULDER 07/01/2007   Qualifier: Diagnosis of  By: Jenny Reichmann MD, Hunt Oris    OSTEOPENIA 05/2019   T score -2.0 FRAX 9.8% / 1.3% stable from prior DEXA    Past Surgical History:  Procedure Laterality Date   cholecystectomy     CYSTOSCOPY WITH RETROGRADE PYELOGRAM, URETEROSCOPY AND STENT PLACEMENT Left 11/06/2018   Procedure: CYSTOSCOPY WITH RETROGRADE PYELOGRAM,  URETEROSCOPY AND STENT PLACEMENT;  Surgeon: Alexis Frock, MD;  Location: [facility];  Service: Urology;  Laterality: Left;   HOLMIUM LASER APPLICATION Left 02/08/320   Procedure: HOLMIUM LASER APPLICATION;  Surgeon: Alexis Frock, MD;  Location: [facility];  Service: Urology;  Laterality: Left;   LASIX Bilateral    uterine polypectomy     WISDOM TOOTH EXTRACTION      There were no vitals filed for this visit.   Subjective Assessment - 02/28/20 1659    Subjective Pt states she is doing good and is able to reach up like before and is able to reach back without issue. She states the ball trigger point release has helped the most. Pt states she feels ready for PT d/c. Pt has been independent and [place]    Patient Stated Goals Improve right shoulder discomfort    Currently in Pain? No/denies              [facility] PT Assessment - 02/28/20 0001      Strength   Right Shoulder Flexion 5/5    Right Shoulder Extension 5/5    Right Shoulder ABduction 5/5    Right Shoulder Internal Rotation 5/5    Right Shoulder  External Rotation 5/5    Right Shoulder Horizontal ABduction 5/5                         [facility] Adult PT Treatment/Exercise - 02/28/20 0001      Shoulder Exercises: ROM/Strengthening   UBE (Upper Arm Bike) L1 x 6 min (2 fwd/bwd)      Shoulder Exercises: Stretch   Internal Rotation Stretch 2 reps   sleeper stretch standing and in sidelying   Other Shoulder Stretches Crossbody stretch x 30 sec sidelying    Other Shoulder Stretches Lat stretch in sitting x 30 sec; tricep stretch with inferior capsule self mob x 30 sec                    PT Short Term Goals - 02/21/20 1653      PT SHORT TERM GOAL #1   Title Patient will be I with initial HEP to progress with PT    Time 4    Period Weeks    Status On-going    Target Date 02/23/20             PT Long Term Goals - 02/28/20 1715       PT LONG TERM GOAL #1   Title Patient will be I with final HEP to maintain progress from PT    Time 6    Period Weeks    Status Achieved      PT LONG TERM GOAL #2   Title Patient will report no difficulty or discomfort with dressing/putting on bra    Time 6    Period Weeks    Status Achieved      PT LONG TERM GOAL #3   Title Patient will exhibit grossly 5/5 MMT right shoulder strength in order to improve pain and control with reaching and lifting overhead    Time 6    Period Weeks    Status Achieved      PT LONG TERM GOAL #4   Title Patient will exhibit full right shoulder AROM with tightness or discomfort to improve household tasks without limitation    Time 6    Period Weeks    Status Achieved              PHYSICAL THERAPY DISCHARGE SUMMARY  Visits from Start of Care: 5  Current functional level related to goals / functional outcomes: Back to PLOF   Remaining deficits: None   Education / Equipment: Stretching and advanced HEP for home  Plan: Patient agrees to discharge.  Patient goals were met. Patient is being discharged due to meeting the stated rehab goals.  ?????          Patient will benefit from skilled therapeutic intervention in order to improve the following deficits and impairments:     Visit Diagnosis: Chronic right shoulder pain  Stiffness of right shoulder, not elsewhere classified  Muscle weakness (generalized)     Problem List Patient Active Problem List   Diagnosis Date Noted   Tick bite of hip, left, initial encounter 01/15/2020   Encounter for well adult exam with abnormal findings 01/13/2020   Kidney stone 01/13/2020   Acute upper respiratory infection 10/09/2017   Left hand pain 10/09/2017   Eustachian tube dysfunction 10/22/2013   Lateral epicondylitis of right elbow 10/22/2013   HLD (hyperlipidemia) 07/26/2010   OSTEOPENIA 07/25/2010   FIBROMYALGIA 10/24/2009   THYROID MASS 08/07/2009   VARICOSE VEINS,  LOWER EXTREMITIES  07/25/2009   PARESTHESIA 12/01/2008   NEPHROLITHIASIS, HX OF 10/28/2007   OSTEOARTHROSIS, LOCAL NOS, 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 [place], [place], [address] Phone: [phone]   Fax:  [phone]  Name: Joanne Prince MRN: [id number] Date of Birth: 1955/11/06

## 2020-03-03 ENCOUNTER — Ambulatory Visit: Admitting: Family Medicine

## 2020-04-11 ENCOUNTER — Encounter: Admitting: Gynecology

## 2020-04-11 ENCOUNTER — Encounter: Admitting: Obstetrics and Gynecology

## 2020-04-12 ENCOUNTER — Ambulatory Visit (INDEPENDENT_AMBULATORY_CARE_PROVIDER_SITE_OTHER): Admitting: Obstetrics and Gynecology

## 2020-04-12 ENCOUNTER — Encounter: Payer: Self-pay | Admitting: Obstetrics and Gynecology

## 2020-04-12 ENCOUNTER — Other Ambulatory Visit: Payer: Self-pay

## 2020-04-12 VITALS — BP 118/74 | Ht 65.0 in | Wt 141.0 lb

## 2020-04-12 DIAGNOSIS — Z79890 Hormone replacement therapy (postmenopausal): Secondary | ICD-10-CM | POA: Diagnosis not present

## 2020-04-12 DIAGNOSIS — R87610 Atypical squamous cells of undetermined significance on cytologic smear of cervix (ASC-US): Secondary | ICD-10-CM | POA: Diagnosis not present

## 2020-04-12 DIAGNOSIS — Z1151 Encounter for screening for human papillomavirus (HPV): Secondary | ICD-10-CM

## 2020-04-12 DIAGNOSIS — M8580 Other specified disorders of bone density and structure, unspecified site: Secondary | ICD-10-CM

## 2020-04-12 DIAGNOSIS — Z01419 Encounter for gynecological examination (general) (routine) without abnormal findings: Secondary | ICD-10-CM | POA: Diagnosis not present

## 2020-04-12 MED ORDER — PROGESTERONE MICRONIZED 100 MG PO CAPS: 100.0000 mg | ORAL_CAPSULE | Freq: Every day | ORAL | 4 refills | Status: DC

## 2020-04-12 MED ORDER — ESTRADIOL 0.05 MG/24HR TD PTTW: 1.0000 | MEDICATED_PATCH | TRANSDERMAL | 4 refills | Status: DC

## 2020-04-12 NOTE — Addendum Note (Signed)
Addended by: Nelva Nay on: 04/12/2020 03:09 PM   Modules accepted: Orders

## 2020-04-12 NOTE — Progress Notes (Signed)
Joanne Prince 1955/12/19 [id number]  SUBJECTIVE:  64 y.o. G2P2 female for annual routine gynecologic exam and Pap smear. She has no gynecologic concerns.  Current Outpatient Medications  Medication Sig Dispense Refill  . APPLE CIDER VINEGAR PO Take by mouth.    . Cholecalciferol (VITAMIN D3 PO) Take by mouth.    . estradiol (VIVELLE-DOT) 0.05 MG/24HR patch Place 1 patch (0.05 mg total) onto the skin 2 (two) times a week. 24 patch 4  . Multiple Vitamin (MULTIVITAMIN) tablet Take 1 tablet by mouth daily.    . progesterone (PROMETRIUM) 100 MG capsule take 1 capsule by mouth ONCE AT BEDTIME 30 capsule 6  . Specialty Vitamins Products (BIOTIN PLUS KERATIN) 10000-100 MCG-MG TABS Take by mouth.     No current facility-administered medications for this visit.   Allergies: Patient has no known allergies.  Patient's last menstrual period was 03/08/2005.  Past medical history,surgical history, problem list, medications, allergies, family history and social history were all reviewed and documented as reviewed in the EPIC chart.  ROS:  Feeling well. No dyspnea or chest pain on exertion.  No abdominal pain, change in bowel habits, black or bloody stools.  No urinary tract symptoms. GYN ROS: no abnormal bleeding, pelvic pain or discharge, no breast pain or new or enlarging lumps on self exam.No neurological complaints.    OBJECTIVE:  BP 118/74   Ht 5\' 5"  (1.651 m)   Wt 141 lb (64 kg)   LMP 03/08/2005   BMI 23.46 kg/m  The patient appears well, alert, oriented x 3, in no distress. ENT normal.  Neck supple. No cervical or supraclavicular adenopathy or thyromegaly.  Lungs are clear, good air entry, no wheezes, rhonchi or rales. S1 and S2 normal, no murmurs, regular rate and rhythm.  Abdomen soft without tenderness, guarding, mass or organomegaly.  Neurological is normal, no focal findings.  BREAST EXAM: breasts appear normal, no suspicious masses, no skin or nipple changes or axillary  nodes  PELVIC EXAM: VULVA: normal appearing vulva with no masses, tenderness or lesions, atrophic changes, VAGINA: normal appearing vagina with normal color and discharge, atrophic changes, no lesions, CERVIX: normal appearing cervix without discharge or lesions, UTERUS: uterus is normal size, shape, consistency and nontender, ADNEXA: normal adnexa in size, nontender and no masses, PAP: Pap smear done today, thin-prep method  Chaperone: Caryn Bee present during the examination  ASSESSMENT:  64 y.o. G2P2 here for annual gynecologic exam  PLAN:   1. Postmenopausal/HRT.  Continues on Vivelle 0.05 mg patch and Prometrium 100 mg daily with good control of hot flash symptoms.  Has recurrence of symptoms if she happens to miss a dose.  She wants to continue on the same dosages.  We again reviewed risks benefits, risks include thrombotic diseases such as heart attack, stroke, DVT, PE, the breast cancer issue.  No vaginal bleeding she knows to notify us if that is occurring.  Will provide with refill x1 year. 2. Pap smear 2020 and 2018 showed ASCUS negative high-risk HPV.  Negative colposcopy with ECC showing benign polyp 04/2019.  Pap smear collected today. 3. Mammogram 05/2019.  Normal breast exam today.  She will be planning to get a mammogram this next month.   4. Colonoscopy 2016.  Recommended that she follow up at the recommended interval.   5. DEXA 05/2019  T score -2.0.  FRAX 9.8% / 1.3.  Is supplementing with vitamin D and calcium.  DEXA recommended 2022. 6. Health maintenance.  No labs today as  she recently had these completed with her primary care doctor.  Return annually or sooner, prn.  Joseph Pierini MD 04/12/20

## 2020-04-13 LAB — PAP IG AND HPV HIGH-RISK: HPV DNA High Risk: NOT DETECTED

## 2020-05-10 ENCOUNTER — Encounter: Payer: Self-pay | Admitting: Obstetrics and Gynecology

## 2020-08-07 ENCOUNTER — Encounter (INDEPENDENT_AMBULATORY_CARE_PROVIDER_SITE_OTHER): Payer: Self-pay | Admitting: Ophthalmology

## 2020-08-07 ENCOUNTER — Ambulatory Visit (INDEPENDENT_AMBULATORY_CARE_PROVIDER_SITE_OTHER): Admitting: Ophthalmology

## 2020-08-07 ENCOUNTER — Other Ambulatory Visit: Payer: Self-pay

## 2020-08-07 DIAGNOSIS — H43811 Vitreous degeneration, right eye: Secondary | ICD-10-CM | POA: Diagnosis not present

## 2020-08-07 DIAGNOSIS — H2513 Age-related nuclear cataract, bilateral: Secondary | ICD-10-CM | POA: Diagnosis not present

## 2020-08-07 DIAGNOSIS — H43812 Vitreous degeneration, left eye: Secondary | ICD-10-CM | POA: Diagnosis not present

## 2020-08-07 DIAGNOSIS — H43822 Vitreomacular adhesion, left eye: Secondary | ICD-10-CM | POA: Insufficient documentation

## 2020-08-07 NOTE — Assessment & Plan Note (Addendum)
The nature of posterior vitreous detachment was discussed with the patient as well as its physiology, its age prevalence, and its possible implication regarding retinal breaks and detachment.  An informational brochure was given to the patient.  All the patient's questions were answered.  The patient was asked to return if new or different flashes or floaters develops.   Patient was instructed to contact office immediately if any changes were noticed. I explained to the patient that vitreous inside the eye is similar to jello inside a bowl. As the jello melts it can start to pull away from the bowl, similarly the vitreous throughout our lives can begin to pull away from the retina. That process is called a posterior vitreous detachment. In some cases, the vitreous can tug hard enough on the retina to form a retinal tear. I discussed with the patient the signs and symptoms of a retinal detachment.  Do not rub the eye.  No retinal holes or tears left eye

## 2020-08-07 NOTE — Assessment & Plan Note (Signed)
Follow-up [facility], Dr. Herbert Deaner or Dr. Kathlen Mody as scheduled

## 2020-08-07 NOTE — Progress Notes (Signed)
08/07/2020     CHIEF COMPLAINT Patient presents for Flashes/floaters   HISTORY OF PRESENT ILLNESS: Joanne Prince is a 64 y.o. female who presents to the clinic today for:   HPI    Flashes/floaters    In left eye.  This started 2 days ago.  Duration of 2 days.  Duration Constant.  Characterized as small.  Since onset it is stable.  Associated Symptoms Flashes.  Treatments tried include no treatments.          Comments    WIP - new floater OS  Pt reports new single floater OS x 2 days. Pt sts symptom onset was sudden. Pt reports intermittent flashes OS. Pt denies ocular pain. Pt reports small floaters OS as well, but mostly noticeable single floater. VA stable OD.       Last edited by Rockie Neighbours, Diamond on 08/07/2020  1:18 PM. (History)      Referring physician: Biagio Borg, MD [place],  [place] [address]  HISTORICAL INFORMATION:   Selected notes from the MEDICAL RECORD NUMBER    Lab Results  Component Value Date   HGBA1C 5.6 04/02/2016     CURRENT MEDICATIONS: No current outpatient medications on file. (Ophthalmic Drugs)   No current facility-administered medications for this visit. (Ophthalmic Drugs)   Current Outpatient Medications (Other)  Medication Sig  . APPLE CIDER VINEGAR PO Take by mouth.  . Cholecalciferol (VITAMIN D3 PO) Take by mouth.  . estradiol (VIVELLE-DOT) 0.05 MG/24HR patch Place 1 patch (0.05 mg total) onto the skin 2 (two) times a week.  . Multiple Vitamin (MULTIVITAMIN) tablet Take 1 tablet by mouth daily.  . progesterone (PROMETRIUM) 100 MG capsule take 1 capsule by mouth ONCE AT BEDTIME  . progesterone (PROMETRIUM) 100 MG capsule Take 1 capsule (100 mg total) by mouth daily.  Marland Kitchen Specialty Vitamins Products (BIOTIN PLUS KERATIN) 10000-100 MCG-MG TABS Take by mouth.   No current facility-administered medications for this visit. (Other)      REVIEW OF SYSTEMS:    ALLERGIES No Known Allergies  PAST MEDICAL  HISTORY Past Medical History:  Diagnosis Date  . ALLERGIC RHINITIS 07/01/2007   Qualifier: Diagnosis of  By: Jenny Reichmann MD, Hunt Oris   . ASCUS of cervix with negative high risk HPV 03/2017, 04/2019  . [place] DISEASE, LUMBAR 07/01/2007   Qualifier: Diagnosis of  By: Jenny Reichmann MD, [place], HX OF 07/01/2007   Qualifier: Diagnosis of  By: Jenny Reichmann MD, Hunt Oris   . Heart murmur   . History of indigestion   . History of kidney stones   . HYPERLIPIDEMIA 07/26/2010   Qualifier: Diagnosis of  By: Jenny Reichmann MD, Hunt Oris   . Kidney stone 01/13/2020  . Kidney stones   . MITRAL VALVE PROLAPSE, HX OF 07/01/2007   Qualifier: Diagnosis of  By: Jenny Reichmann MD, Hunt Oris   . NEPHROLITHIASIS, HX OF 10/28/2007   Qualifier: Diagnosis of  By: Linda Hedges MD, Worthy Rancher, LOCAL NOS, SHOULDER 07/01/2007   Qualifier: Diagnosis of  By: Jenny Reichmann MD, Hunt Oris   . OSTEOPENIA 05/2019   T score -2.0 FRAX 9.8% / 1.3% stable from prior DEXA   Past Surgical History:  Procedure Laterality Date  . cholecystectomy    . CYSTOSCOPY WITH RETROGRADE PYELOGRAM, URETEROSCOPY AND STENT PLACEMENT Left 11/06/2018   Procedure: CYSTOSCOPY WITH RETROGRADE PYELOGRAM, URETEROSCOPY AND STENT PLACEMENT;  Surgeon: Alexis Frock, MD;  Location: [facility];  Service: Urology;  Laterality: Left;  . HOLMIUM LASER APPLICATION Left 11/04/7423   Procedure: HOLMIUM LASER APPLICATION;  Surgeon: Alexis Frock, MD;  Location: [facility];  Service: Urology;  Laterality: Left;  . LASIX Bilateral   . uterine polypectomy    . WISDOM TOOTH EXTRACTION      FAMILY HISTORY Family History  Problem Relation Age of Onset  . Hypertension Mother   . Celiac disease Maternal Uncle     SOCIAL HISTORY Social History   Tobacco Use  . Smoking status: Never Smoker  . Smokeless tobacco: Never Used  Vaping Use  . Vaping Use: Never used  Substance Use Topics  . Alcohol use: Yes    Alcohol/week: 0.0 standard drinks    Comment:  Rare  . Drug use: No         OPHTHALMIC EXAM:  Base Eye Exam    Visual Acuity (ETDRS)      Right Left   Dist [place] 20/20 20/60 near eye +2   Dist ph [place]  20/20       Tonometry (Tonopen, 1:19 PM)      Right Left   Pressure 15 19       Pupils      Pupils Dark Light Shape React APD   Right PERRL 5 4 Round Brisk None   Left PERRL 5 4 Round Brisk None       Visual Fields (Counting fingers)      Left Right    Full Full       Extraocular Movement      Right Left    Full Full       Neuro/Psych    Oriented x3: Yes   Mood/Affect: Normal       Dilation    Left eye: 1.0% Mydriacyl, 2.5% Phenylephrine @ 1:23 PM        Slit Lamp and Fundus Exam    External Exam      Right Left   External Normal Normal       Slit Lamp Exam      Right Left   Lids/Lashes Normal Normal   Conjunctiva/Sclera White and quiet White and quiet   Cornea Clear Clear   Anterior Chamber Deep and quiet Deep and quiet   Iris Round and reactive Round and reactive   Lens 1+ Nuclear sclerosis 1+ Nuclear sclerosis   Anterior Vitreous Normal Posterior vitreous detachment       Fundus Exam      Right Left   Posterior Vitreous  Posterior vitreous detachment   Disc  Normal   C/D Ratio  0.45   Macula  Normal   Vessels  Normal   Periphery  No retinal holes or tears,          IMAGING AND PROCEDURES  Imaging and Procedures for 08/07/20  Color Fundus Photography Optos - OU - Both Eyes       Right Eye Progression has been stable.   Left Eye Progression has been stable. Disc findings include normal observations. Macula : normal observations.   Notes Bilateral posterior vitreous detachment clear media                ASSESSMENT/PLAN:  Posterior vitreous detachment of left eye   The nature of posterior vitreous detachment was discussed with the patient as well as its physiology, its age prevalence, and its possible implication regarding retinal breaks and detachment.  An  informational brochure was given to the patient.  All  the patient's questions were answered.  The patient was asked to return if new or different flashes or floaters develops.   Patient was instructed to contact office immediately if any changes were noticed. I explained to the patient that vitreous inside the eye is similar to jello inside a bowl. As the jello melts it can start to pull away from the bowl, similarly the vitreous throughout our lives can begin to pull away from the retina. That process is called a posterior vitreous detachment. In some cases, the vitreous can tug hard enough on the retina to form a retinal tear. I discussed with the patient the signs and symptoms of a retinal detachment.  Do not rub the eye.  No retinal holes or tears left eye  Nuclear sclerotic cataract of both eyes Follow-up [facility], Dr. Herbert Deaner or Dr. Kathlen Mody as scheduled      ICD-10-CM   1. Posterior vitreous detachment of left eye  H43.812   2. Posterior vitreous detachment of right eye  H43.811 Color Fundus Photography Optos - OU - Both Eyes  3. Vitreomacular adhesion of left eye  H43.822 Color Fundus Photography Optos - OU - Both Eyes  4. Nuclear sclerotic cataract of both eyes  H25.13     1.  No signs of hole or tear left eye will observe  2.  3.  Ophthalmic Meds Ordered this visit:  No orders of the defined types were placed in this encounter.      Return in about 8 weeks (around 10/02/2020) for DILATE OU.  There are no Patient Instructions on file for this visit.   Explained the diagnoses, plan, and follow up with the patient and they expressed understanding.  Patient expressed understanding of the importance of proper follow up care.   Clent Demark Quatisha Zylka M.D. Diseases & Surgery of the Retina and Vitreous Retina & Diabetic [place] 08/07/20     Abbreviations: M myopia (nearsighted); A astigmatism; H hyperopia (farsighted); P presbyopia; Mrx spectacle prescription;  CTL contact  lenses; OD right eye; OS left eye; OU both eyes  XT exotropia; ET esotropia; PEK punctate epithelial keratitis; PEE punctate epithelial erosions; DES dry eye syndrome; MGD meibomian gland dysfunction; ATs artificial tears; PFAT's preservative free artificial tears; [place] nuclear sclerotic cataract; PSC posterior subcapsular cataract; ERM epi-retinal membrane; PVD posterior vitreous detachment; RD retinal detachment; DM diabetes mellitus; DR diabetic retinopathy; NPDR non-proliferative diabetic retinopathy; PDR proliferative diabetic retinopathy; CSME clinically significant macular edema; DME diabetic macular edema; dbh dot blot hemorrhages; CWS cotton wool spot; POAG primary open angle glaucoma; C/D cup-to-disc ratio; HVF humphrey visual field; GVF goldmann visual field; OCT optical coherence tomography; IOP intraocular pressure; BRVO Branch retinal vein occlusion; CRVO central retinal vein occlusion; CRAO central retinal artery occlusion; BRAO branch retinal artery occlusion; RT retinal tear; SB scleral buckle; PPV pars plana vitrectomy; VH Vitreous hemorrhage; PRP panretinal laser photocoagulation; IVK intravitreal kenalog; VMT vitreomacular traction; MH Macular hole;  NVD neovascularization of the disc; NVE neovascularization elsewhere; AREDS age related eye disease study; ARMD age related macular degeneration; POAG primary open angle glaucoma; EBMD epithelial/anterior basement membrane dystrophy; ACIOL anterior chamber intraocular lens; IOL intraocular lens; PCIOL posterior chamber intraocular lens; Phaco/IOL phacoemulsification with intraocular lens placement; [place] photorefractive keratectomy; LASIK laser assisted in situ keratomileusis; HTN hypertension; DM diabetes mellitus; COPD chronic obstructive pulmonary disease

## 2020-10-02 ENCOUNTER — Ambulatory Visit (INDEPENDENT_AMBULATORY_CARE_PROVIDER_SITE_OTHER): Admitting: Ophthalmology

## 2020-10-02 ENCOUNTER — Other Ambulatory Visit: Payer: Self-pay

## 2020-10-02 ENCOUNTER — Encounter (INDEPENDENT_AMBULATORY_CARE_PROVIDER_SITE_OTHER): Payer: Self-pay | Admitting: Ophthalmology

## 2020-10-02 DIAGNOSIS — H2513 Age-related nuclear cataract, bilateral: Secondary | ICD-10-CM

## 2020-10-02 DIAGNOSIS — H43811 Vitreous degeneration, right eye: Secondary | ICD-10-CM

## 2020-10-02 DIAGNOSIS — H43812 Vitreous degeneration, left eye: Secondary | ICD-10-CM | POA: Diagnosis not present

## 2020-10-02 NOTE — Assessment & Plan Note (Signed)

## 2020-10-02 NOTE — Progress Notes (Signed)
10/02/2020     CHIEF COMPLAINT Patient presents for Retina Follow Up (8 Week PVD f\u. OCT/Pt states she can see "a heartbeat" in OS for the past 2 weeks. Pt states she is also seeing FOL in OS. Denies ocular pain.)   HISTORY OF PRESENT ILLNESS: Joanne Prince is a 65 y.o. female who presents to the clinic today for:   HPI    Retina Follow Up    Patient presents with  PVD.  In left eye.  Severity is moderate.  Duration of 8 weeks.  Since onset it is stable.  I, the attending physician,  performed the HPI with the patient and updated documentation appropriately. Additional comments: 8 Week PVD f\u. OCT Pt states she can see "a heartbeat" in OS for the past 2 weeks. Pt states she is also seeing FOL in OS. Denies ocular pain.       Last edited by Tilda Franco on 10/02/2020  1:15 PM. (History)      Referring physician: Biagio Borg, MD [place],  [place] [id number]  HISTORICAL INFORMATION:   Selected notes from the MEDICAL RECORD NUMBER    Lab Results  Component Value Date   HGBA1C 5.6 04/02/2016     CURRENT MEDICATIONS: No current outpatient medications on file. (Ophthalmic Drugs)   No current facility-administered medications for this visit. (Ophthalmic Drugs)   Current Outpatient Medications (Other)  Medication Sig   APPLE CIDER VINEGAR PO Take by mouth.   Cholecalciferol (VITAMIN D3 PO) Take by mouth.   estradiol (VIVELLE-DOT) 0.05 MG/24HR patch Place 1 patch (0.05 mg total) onto the skin 2 (two) times a week.   Multiple Vitamin (MULTIVITAMIN) tablet Take 1 tablet by mouth daily.   progesterone (PROMETRIUM) 100 MG capsule take 1 capsule by mouth ONCE AT BEDTIME   progesterone (PROMETRIUM) 100 MG capsule Take 1 capsule (100 mg total) by mouth daily.   Specialty Vitamins Products (BIOTIN PLUS KERATIN) 10000-100 MCG-MG TABS Take by mouth.   No current facility-administered medications for this visit. (Other)      REVIEW OF  SYSTEMS:    ALLERGIES No Known Allergies  PAST MEDICAL HISTORY Past Medical History:  Diagnosis Date   ALLERGIC RHINITIS 07/01/2007   Qualifier: Diagnosis of  By: Jenny Reichmann MD, Hunt Oris    ASCUS of cervix with negative high risk HPV 03/2017, 04/2019   [place] DISEASE, LUMBAR 07/01/2007   Qualifier: Diagnosis of  By: Jenny Reichmann MD, [place], HX OF 07/01/2007   Qualifier: Diagnosis of  By: Jenny Reichmann MD, Hunt Oris    Heart murmur    History of indigestion    History of kidney stones    HYPERLIPIDEMIA 07/26/2010   Qualifier: Diagnosis of  By: Jenny Reichmann MD, Hunt Oris    Kidney stone 01/13/2020   Kidney stones    MITRAL VALVE PROLAPSE, HX OF 07/01/2007   Qualifier: Diagnosis of  By: Jenny Reichmann MD, [place], HX OF 10/28/2007   Qualifier: Diagnosis of  By: Linda Hedges MD, Worthy Rancher, LOCAL NOS, SHOULDER 07/01/2007   Qualifier: Diagnosis of  By: Jenny Reichmann MD, Hunt Oris    OSTEOPENIA 05/2019   T score -2.0 FRAX 9.8% / 1.3% stable from prior DEXA   Past Surgical History:  Procedure Laterality Date   cholecystectomy     CYSTOSCOPY WITH RETROGRADE PYELOGRAM, URETEROSCOPY AND STENT PLACEMENT Left 11/06/2018   Procedure: CYSTOSCOPY WITH RETROGRADE PYELOGRAM, URETEROSCOPY AND STENT  PLACEMENT;  Surgeon: Alexis Frock, MD;  Location[facility];  Service: Urology;  Laterality: Left;   HOLMIUM LASER APPLICATION Left [id number]   Procedure: HOLMIUM LASER APPLICATION;  Surgeon: Alexis Frock, MD;  Location: [facility];  Service: Urology;  Laterality: Left;   LASIX Bilateral    uterine polypectomy     WISDOM TOOTH EXTRACTION      FAMILY HISTORY Family History  Problem Relation Age of Onset   Hypertension Mother    Celiac disease Maternal Uncle     SOCIAL HISTORY Social History   Tobacco Use   Smoking status: Never Smoker   Smokeless tobacco: Never Used  Vaping Use   Vaping Use: Never used  Substance Use Topics    Alcohol use: Yes    Alcohol/week: 0.0 standard drinks    Comment: Rare   Drug use: No         OPHTHALMIC EXAM: Base Eye Exam    Visual Acuity (Snellen - Linear)      Right Left   Dist [place] 20/20 20/60   Dist ph [place]  20/25 -2       Tonometry (Tonopen, 1:18 PM)      Right Left   Pressure 17 17       Pupils      Pupils Dark Light Shape React APD   Right PERRL 4 3 Round Slow None   Left PERRL 4 3 Round Brisk None       Visual Fields (Counting fingers)      Left Right    Full Full       Neuro/Psych    Oriented x3: Yes   Mood/Affect: Normal       Dilation    Both eyes: 1.0% Mydriacyl, 2.5% Phenylephrine @ 1:18 PM        Slit Lamp and Fundus Exam    External Exam      Right Left   External Normal Normal       Slit Lamp Exam      Right Left   Lids/Lashes Normal Normal   Conjunctiva/Sclera White and quiet White and quiet   Cornea Clear Clear   Anterior Chamber Deep and quiet Deep and quiet   Iris Round and reactive Round and reactive   Lens 1+ Nuclear sclerosis 1+ Nuclear sclerosis   Anterior Vitreous Normal Posterior vitreous detachment       Fundus Exam      Right Left   Posterior Vitreous Posterior vitreous detachment Posterior vitreous detachment   Disc Normal Normal   C/D Ratio 0.4 0.5   Macula Normal Normal   Vessels Normal Normal   Periphery Normal No retinal holes or tears, pigment at 2, no break          IMAGING AND PROCEDURES  Imaging and Procedures for 10/02/20  OCT, Retina - OU - Both Eyes       Right Eye Quality was good. Scan locations included subfoveal. Central Foveal Thickness: 281.   Left Eye Quality was good. Scan locations included subfoveal. Central Foveal Thickness: 283.                 ASSESSMENT/PLAN:  Nuclear sclerotic cataract of both eyes The nature of cataract was discussed with the patient as well as the elective nature of surgery. The patient was reassured that surgery at a later date does not put the  patient at risk for a worse outcome. It was emphasized that the need for surgery is  dictated by the patient's quality of life as influenced by the cataract. Patient was instructed to maintain close follow up with their general eye care doctor.  Posterior vitreous detachment of right eye   The nature of posterior vitreous detachment was discussed with the patient as well as its physiology, its age prevalence, and its possible implication regarding retinal breaks and detachment.  An informational brochure was given to the patient.  All the patient's questions were answered.  The patient was asked to return if new or different flashes or floaters develops.   Patient was instructed to contact office immediately if any changes were noticed. I explained to the patient that vitreous inside the eye is similar to jello inside a bowl. As the jello melts it can start to pull away from the bowl, similarly the vitreous throughout our lives can begin to pull away from the retina. That process is called a posterior vitreous detachment. In some cases, the vitreous can tug hard enough on the retina to form a retinal tear. I discussed with the patient the signs and symptoms of a retinal detachment.  Do not rub the eye.      ICD-10-CM   1. Posterior vitreous detachment of left eye  H43.812 OCT, Retina - OU - Both Eyes  2. Posterior vitreous detachment of right eye  H43.811 OCT, Retina - OU - Both Eyes  3. Nuclear sclerotic cataract of both eyes  H25.13     1.  2.  3.  Ophthalmic Meds Ordered this visit:  No orders of the defined types were placed in this encounter.      Return for DILATE OU, OCT, As scheduled.  There are no Patient Instructions on file for this visit.   Explained the diagnoses, plan, and follow up with the patient and they expressed understanding.  Patient expressed understanding of the importance of proper follow up care.   Clent Demark Cyree Chuong M.D. Diseases & Surgery of the Retina and  Vitreous Retina & Diabetic [place] 10/02/20     Abbreviations: M myopia (nearsighted); A astigmatism; H hyperopia (farsighted); P presbyopia; Mrx spectacle prescription;  CTL contact lenses; OD right eye; OS left eye; OU both eyes  XT exotropia; ET esotropia; PEK punctate epithelial keratitis; PEE punctate epithelial erosions; DES dry eye syndrome; MGD meibomian gland dysfunction; ATs artificial tears; PFAT's preservative free artificial tears; [place] nuclear sclerotic cataract; PSC posterior subcapsular cataract; ERM epi-retinal membrane; PVD posterior vitreous detachment; RD retinal detachment; DM diabetes mellitus; DR diabetic retinopathy; NPDR non-proliferative diabetic retinopathy; PDR proliferative diabetic retinopathy; CSME clinically significant macular edema; DME diabetic macular edema; dbh dot blot hemorrhages; CWS cotton wool spot; POAG primary open angle glaucoma; C/D cup-to-disc ratio; HVF humphrey visual field; GVF goldmann visual field; OCT optical coherence tomography; IOP intraocular pressure; BRVO Branch retinal vein occlusion; CRVO central retinal vein occlusion; CRAO central retinal artery occlusion; BRAO branch retinal artery occlusion; RT retinal tear; SB scleral buckle; PPV pars plana vitrectomy; VH Vitreous hemorrhage; PRP panretinal laser photocoagulation; IVK intravitreal kenalog; VMT vitreomacular traction; MH Macular hole;  NVD neovascularization of the disc; NVE neovascularization elsewhere; AREDS age related eye disease study; ARMD age related macular degeneration; POAG primary open angle glaucoma; EBMD epithelial/anterior basement membrane dystrophy; ACIOL anterior chamber intraocular lens; IOL intraocular lens; PCIOL posterior chamber intraocular lens; Phaco/IOL phacoemulsification with intraocular lens placement; [place] photorefractive keratectomy; LASIK laser assisted in situ keratomileusis; HTN hypertension; DM diabetes mellitus; COPD chronic obstructive pulmonary disease

## 2020-10-02 NOTE — Assessment & Plan Note (Signed)

## 2020-10-23 ENCOUNTER — Encounter: Payer: Self-pay | Admitting: Internal Medicine

## 2020-10-23 LAB — HM DIABETES EYE EXAM

## 2020-12-05 ENCOUNTER — Telehealth: Payer: Self-pay | Admitting: Internal Medicine

## 2020-12-05 DIAGNOSIS — E7800 Pure hypercholesterolemia, unspecified: Secondary | ICD-10-CM

## 2020-12-05 DIAGNOSIS — E538 Deficiency of other specified B group vitamins: Secondary | ICD-10-CM

## 2020-12-05 DIAGNOSIS — R739 Hyperglycemia, unspecified: Secondary | ICD-10-CM

## 2020-12-05 DIAGNOSIS — E559 Vitamin D deficiency, unspecified: Secondary | ICD-10-CM

## 2020-12-05 NOTE — Telephone Encounter (Signed)
Ok this is done thanks 

## 2020-12-05 NOTE — Telephone Encounter (Signed)
Pt has CPE scheduled 5/16 and lab appt 5/11, need lab orders please.

## 2021-01-10 ENCOUNTER — Other Ambulatory Visit (INDEPENDENT_AMBULATORY_CARE_PROVIDER_SITE_OTHER)

## 2021-01-10 ENCOUNTER — Other Ambulatory Visit: Payer: Self-pay

## 2021-01-10 DIAGNOSIS — E559 Vitamin D deficiency, unspecified: Secondary | ICD-10-CM | POA: Diagnosis not present

## 2021-01-10 DIAGNOSIS — E7800 Pure hypercholesterolemia, unspecified: Secondary | ICD-10-CM

## 2021-01-10 DIAGNOSIS — R739 Hyperglycemia, unspecified: Secondary | ICD-10-CM | POA: Diagnosis not present

## 2021-01-10 DIAGNOSIS — E538 Deficiency of other specified B group vitamins: Secondary | ICD-10-CM

## 2021-01-10 LAB — URINALYSIS, ROUTINE W REFLEX MICROSCOPIC
Bilirubin Urine: NEGATIVE
Hgb urine dipstick: NEGATIVE
Ketones, ur: NEGATIVE
Leukocytes,Ua: NEGATIVE
Nitrite: NEGATIVE
RBC / HPF: NONE SEEN (ref 0–?)
Specific Gravity, Urine: <=1.005 — AB (ref 1.000–1.030)
Total Protein, Urine: NEGATIVE
Urine Glucose: NEGATIVE
Urobilinogen, UA: 0.2 (ref 0.0–1.0)
WBC, UA: NONE SEEN (ref 0–?)
pH: 6 (ref 5.0–8.0)

## 2021-01-10 LAB — VITAMIN D 25 HYDROXY (VIT D DEFICIENCY, FRACTURES): VITD: 62.6 ng/mL (ref 30.00–100.00)

## 2021-01-10 LAB — CBC WITH DIFFERENTIAL/PLATELET
Basophils Absolute: 0.1 10*3/uL (ref 0.0–0.1)
Basophils Relative: 1.2 % (ref 0.0–3.0)
Eosinophils Absolute: 0.1 10*3/uL (ref 0.0–0.7)
Eosinophils Relative: 1.5 % (ref 0.0–5.0)
HCT: 44.4 % (ref 36.0–46.0)
Hemoglobin: 14.8 g/dL (ref 12.0–15.0)
Lymphocytes Relative: 29 % (ref 12.0–46.0)
Lymphs Abs: 1.4 10*3/uL (ref 0.7–4.0)
MCHC: 33.3 g/dL (ref 30.0–36.0)
MCV: 90.5 fl (ref 78.0–100.0)
Monocytes Absolute: 0.3 10*3/uL (ref 0.1–1.0)
Monocytes Relative: 7.3 % (ref 3.0–12.0)
Neutro Abs: 2.9 10*3/uL (ref 1.4–7.7)
Neutrophils Relative %: 61 % (ref 43.0–77.0)
Platelets: 229 10*3/uL (ref 150.0–400.0)
RBC: 4.91 Mil/uL (ref 3.87–5.11)
RDW: 12.9 % (ref 11.5–15.5)
WBC: 4.7 10*3/uL (ref 4.0–10.5)

## 2021-01-10 LAB — BASIC METABOLIC PANEL
BUN: 15 mg/dL (ref 6–23)
CO2: 28 mEq/L (ref 19–32)
Calcium: 9 mg/dL (ref 8.4–10.5)
Chloride: 103 mEq/L (ref 96–112)
Creatinine, Ser: 0.78 mg/dL (ref 0.40–1.20)
GFR: 80.79 mL/min (ref >60.00)
Glucose, Bld: 106 mg/dL — ABNORMAL HIGH (ref 70–99)
Potassium: 4 mEq/L (ref 3.5–5.1)
Sodium: 138 mEq/L (ref 135–145)

## 2021-01-10 LAB — VITAMIN B12: Vitamin B-12: 348 pg/mL (ref 211–911)

## 2021-01-10 LAB — LIPID PANEL
Cholesterol: 181 mg/dL (ref 0–200)
HDL: 58 mg/dL (ref >39.00)
LDL Cholesterol: 107 mg/dL — ABNORMAL HIGH (ref 0–99)
NonHDL: 122.75
Total CHOL/HDL Ratio: 3
Triglycerides: 77 mg/dL (ref 0.0–149.0)
VLDL: 15.4 mg/dL (ref 0.0–40.0)

## 2021-01-10 LAB — TSH: TSH: 1.32 u[IU]/mL (ref 0.35–4.50)

## 2021-01-10 LAB — HEPATIC FUNCTION PANEL
ALT: 13 U/L (ref 0–35)
AST: 19 U/L (ref 0–37)
Albumin: 4.2 g/dL (ref 3.5–5.2)
Alkaline Phosphatase: 47 U/L (ref 39–117)
Bilirubin, Direct: 0.3 mg/dL (ref 0.0–0.3)
Total Bilirubin: 1.4 mg/dL — ABNORMAL HIGH (ref 0.2–1.2)
Total Protein: 7.1 g/dL (ref 6.0–8.3)

## 2021-01-10 LAB — HEMOGLOBIN A1C: Hgb A1c MFr Bld: 5.7 % (ref 4.6–6.5)

## 2021-01-15 ENCOUNTER — Encounter: Admitting: Internal Medicine

## 2021-01-17 ENCOUNTER — Other Ambulatory Visit: Payer: Self-pay

## 2021-01-18 ENCOUNTER — Ambulatory Visit (INDEPENDENT_AMBULATORY_CARE_PROVIDER_SITE_OTHER): Admitting: Internal Medicine

## 2021-01-18 ENCOUNTER — Telehealth: Payer: Self-pay | Admitting: Internal Medicine

## 2021-01-18 ENCOUNTER — Encounter: Payer: Self-pay | Admitting: Internal Medicine

## 2021-01-18 ENCOUNTER — Other Ambulatory Visit: Payer: Self-pay

## 2021-01-18 VITALS — BP 156/74 | HR 78 | Temp 98.6°F | Ht 65.0 in | Wt 140.0 lb

## 2021-01-18 DIAGNOSIS — E7800 Pure hypercholesterolemia, unspecified: Secondary | ICD-10-CM

## 2021-01-18 DIAGNOSIS — R739 Hyperglycemia, unspecified: Secondary | ICD-10-CM

## 2021-01-18 DIAGNOSIS — R030 Elevated blood-pressure reading, without diagnosis of hypertension: Secondary | ICD-10-CM | POA: Diagnosis not present

## 2021-01-18 DIAGNOSIS — Z0001 Encounter for general adult medical examination with abnormal findings: Secondary | ICD-10-CM

## 2021-01-18 DIAGNOSIS — L989 Disorder of the skin and subcutaneous tissue, unspecified: Secondary | ICD-10-CM

## 2021-01-18 NOTE — Patient Instructions (Signed)
Please continue all other medications as before, and refills have been done if requested.  Please have the pharmacy call with any other refills you may need.  Please continue your efforts at being more active, low cholesterol diet, and weight control.  You are otherwise up to date with prevention measures today.  Please keep your appointments with your specialists as you may have planned  You will be contacted regarding the referral for: Dermatology  We have discussed the Cardiac CT Score test to measure the calcification level (if any) in your heart arteries.  This test has been ordered in our [place], so please call  CT directly, as they prefer this, at [phone] to be scheduled.  Please make an Appointment to return for your 1 year visit, or sooner if needed, with Lab testing by Appointment as well, to be done about 3-5 days before at the [facility] (so this is for TWO appointments - please see the scheduling desk as you leave)  Due to the ongoing Covid 19 pandemic, our lab now requires an appointment for any labs done at our office.  If you need labs done and do not have an appointment, please call our office ahead of time to schedule before presenting to the lab for your testing.

## 2021-01-18 NOTE — Telephone Encounter (Signed)
Patient wants to go ahead and make her lab appointment to get her lab work done before her physical next year, requesting we put the orders in

## 2021-01-18 NOTE — Progress Notes (Signed)
Patient ID: Joanne Prince, female   DOB: March 27, 1956, 65 y.o.   MRN: [id number]         Chief Complaint:: wellness exam and elevated BP, hyperglycemia, hld, skin lesion       HPI:  Joanne Prince is a 65 y.o. female here for wellness exam; ow up to date with preventive referrals and immunizations                        Also Pt denies chest pain, increased sob or doe, wheezing, orthopnea, PND, increased LE swelling, palpitations, dizziness or syncope.   Pt denies polydipsia, polyuria, or new focal neuro s/s.  Trying to follow lower cholesterol diet, does not really want statin but is interested in cardiac CT score and consider then.  Also has a skin lesion to back, asks for dermatology referral.    Pt denies fever, wt loss, night sweats, loss of appetite, or other constitutional symptoms  No other new complaints Wt Readings from Last 3 Encounters:  01/18/21 140 lb (63.5 kg)  04/12/20 141 lb (64 kg)  01/21/20 142 lb (64.4 kg)   BP Readings from Last 3 Encounters:  01/18/21 (!) 156/74  04/12/20 118/74  01/21/20 140/82   Immunization History  Administered Date(s) Administered  . Influenza Inj Mdck Quad Pf 05/26/2019  . Influenza Split 06/09/2012, 04/14/2016  . Influenza Whole 06/21/2008, 07/25/2009  . Influenza,inj,Quad PF,6+ Mos 07/10/2013, 06/16/2014, 04/29/2017, 04/27/2018  . Influenza-Unspecified 06/27/2015, 04/29/2017  . PFIZER(Purple Top)SARS-COV-2 Vaccination 10/29/2019, 11/19/2019, 07/28/2020  . Td 09/02/2001, 07/03/2010  . Tdap 03/08/2015  . Zoster Recombinat (Shingrix) 04/29/2017, 12/18/2017, 12/19/2017  There are no preventive care reminders to display for this patient.    Past Medical History:  Diagnosis Date  . ALLERGIC RHINITIS 07/01/2007   Qualifier: Diagnosis of  By: Jenny Reichmann MD, [place] Oris   . ASCUS of cervix with negative high risk HPV 03/2017, 04/2019  . [place] DISEASE, LUMBAR 07/01/2007   Qualifier: Diagnosis of  By: Jenny Reichmann MD, [place], HX OF 07/01/2007    Qualifier: Diagnosis of  By: Jenny Reichmann MD, [place] Oris   . Heart murmur   . History of indigestion   . History of kidney stones   . HYPERLIPIDEMIA 07/26/2010   Qualifier: Diagnosis of  By: Jenny Reichmann MD, [place] Oris   . Kidney stone 01/13/2020  . Kidney stones   . MITRAL VALVE PROLAPSE, HX OF 07/01/2007   Qualifier: Diagnosis of  By: Jenny Reichmann MD, Hunt Oris   . NEPHROLITHIASIS, HX OF 10/28/2007   Qualifier: Diagnosis of  By: Linda Hedges MD, Worthy Rancher, LOCAL NOS, SHOULDER 07/01/2007   Qualifier: Diagnosis of  By: Jenny Reichmann MD, Hunt Oris   . OSTEOPENIA 05/2019   T score -2.0 FRAX 9.8% / 1.3% stable from prior DEXA   Past Surgical History:  Procedure Laterality Date  . cholecystectomy    . CYSTOSCOPY WITH RETROGRADE PYELOGRAM, URETEROSCOPY AND STENT PLACEMENT Left 11/06/2018   Procedure: CYSTOSCOPY WITH RETROGRADE PYELOGRAM, URETEROSCOPY AND STENT PLACEMENT;  Surgeon: Alexis Frock, MD;  Location: [facility];  Service: Urology;  Laterality: Left;  . HOLMIUM LASER APPLICATION Left 0/10/4095   Procedure: HOLMIUM LASER APPLICATION;  Surgeon: Alexis Frock, MD;  Location: [facility];  Service: Urology;  Laterality: Left;  . LASIX Bilateral   . uterine polypectomy    . WISDOM TOOTH EXTRACTION      reports that she has never smoked. She  has never used smokeless tobacco. She reports current alcohol use. She reports that she does not use drugs. family history includes Celiac disease in her maternal uncle; Hypertension in her mother. No Known Allergies Current Outpatient Medications on File Prior to Visit  Medication Sig Dispense Refill  . APPLE CIDER VINEGAR PO Take by mouth.    . Ascorbic Acid (VITAMIN C PO) Take by mouth.    . Cholecalciferol (VITAMIN D3 PO) Take by mouth.    . estradiol (VIVELLE-DOT) 0.05 MG/24HR patch Place 1 patch (0.05 mg total) onto the skin 2 (two) times a week. 24 patch 4  . Multiple Vitamin (MULTIVITAMIN) tablet Take 1 tablet by mouth daily.    .  progesterone (PROMETRIUM) 100 MG capsule Take 1 capsule (100 mg total) by mouth daily. 90 capsule 4  . Specialty Vitamins Products (BIOTIN PLUS KERATIN) 10000-100 MCG-MG TABS Take by mouth.     No current facility-administered medications on file prior to visit.        ROS:  All others reviewed and negative.  Objective        PE:  BP (!) 156/74 (BP Location: Right Arm, Patient Position: Sitting, Cuff Size: Normal)   Pulse 78   Temp 98.6 F (37 C) (Oral)   Ht 5\' 5"  (1.651 m)   Wt 140 lb (63.5 kg)   LMP 03/08/2005   SpO2 99%   BMI 23.30 kg/m                 Constitutional: Pt appears in NAD               HENT: Head: NCAT.                Right Ear: External ear normal.                 Left Ear: External ear normal.                Eyes: . Pupils are equal, round, and reactive to light. Conjunctivae and EOM are normal               Nose: without d/c or deformity               Neck: Neck supple. Gross normal ROM               Cardiovascular: Normal rate and regular rhythm.                 Pulmonary/Chest: Effort normal and breath sounds without rales or wheezing.                Abd:  Soft, NT, ND, + BS, no organomegaly               Neurological: Pt is alert. At baseline orientation, motor grossly intact               Skin: Skin is warm. No rashes, new back lesion dark raised noted, LE edema - none               Psychiatric: Pt behavior is normal without agitation   Micro: none  Cardiac tracings I have personally interpreted today:  none  Pertinent Radiological findings (summarize): none   Lab Results  Component Value Date   WBC 4.7 01/10/2021   HGB 14.8 01/10/2021   HCT 44.4 01/10/2021   PLT 229.0 01/10/2021   GLUCOSE 106 (H) 01/10/2021   CHOL 181 01/10/2021   TRIG 77.0 01/10/2021  HDL 58.00 01/10/2021   LDLDIRECT 113.0 01/13/2020   LDLCALC 107 (H) 01/10/2021   ALT 13 01/10/2021   AST 19 01/10/2021   NA 138 01/10/2021   K 4.0 01/10/2021   CL 103 01/10/2021    CREATININE 0.78 01/10/2021   BUN 15 01/10/2021   CO2 28 01/10/2021   TSH 1.32 01/10/2021   HGBA1C 5.7 01/10/2021   Assessment/Plan:  Joanne Prince is a 65 y.o. White or Caucasian [1] female with  has a past medical history of ALLERGIC RHINITIS (07/01/2007), ASCUS of cervix with negative high risk HPV (03/2017, 04/2019), [place] DISEASE, LUMBAR (07/01/2007), GENITAL HERPES, HX OF (07/01/2007), Heart murmur, History of indigestion, History of kidney stones, HYPERLIPIDEMIA (07/26/2010), Kidney stone (01/13/2020), Kidney stones, MITRAL VALVE PROLAPSE, HX OF (07/01/2007), NEPHROLITHIASIS, HX OF (10/28/2007), OSTEOARTHROSIS, LOCAL NOS, SHOULDER (07/01/2007), and OSTEOPENIA (05/2019).  Encounter for well adult exam with abnormal findings Age and sex appropriate education and counseling updated with regular exercise and diet Referrals for preventative services - none needed Immunizations addressed - none needed Smoking counseling  - none needed Evidence for depression or other mood disorder - none significant Most recent labs reviewed. I have personally reviewed and have noted: 1) the patient's medical and social history 2) The patient's current medications and supplements 3) The patient's height, weight, and BMI have been recorded in the chart   Skin lesion Atypical, etiology unclear, for derm referral  HLD (hyperlipidemia) Lab Results  Component Value Date   LDLCALC 107 (H) 01/10/2021   Stable, pt to continue current low chol diet, for ct cardiac score, consider statin if abnormal   Blood pressure elevated without history of HTN BP Readings from Last 3 Encounters:  01/18/21 (!) 156/74  04/12/20 118/74  01/21/20 140/82   Elevated today, ? reactive, pt to continue to check BP at home daily for 10 days and let us know results  Current Outpatient Medications (Endocrine & Metabolic):  .  estradiol (VIVELLE-DOT) 0.05 MG/24HR patch, Place 1 patch (0.05 mg total) onto the skin 2 (two) times a  week. .  progesterone (PROMETRIUM) 100 MG capsule, Take 1 capsule (100 mg total) by mouth daily.      Current Outpatient Medications (Other):  Marland Kitchen  APPLE CIDER VINEGAR PO, Take by mouth. .  Ascorbic Acid (VITAMIN C PO), Take by mouth. .  Cholecalciferol (VITAMIN D3 PO), Take by mouth. .  Multiple Vitamin (MULTIVITAMIN) tablet, Take 1 tablet by mouth daily. Marland Kitchen  Specialty Vitamins Products (BIOTIN PLUS KERATIN) 10000-100 MCG-MG TABS, Take by mouth.   Followup: Return in about 1 year (around 01/18/2022).  Cathlean Cower, MD 01/21/2021 1:47 PM [facility]

## 2021-01-21 ENCOUNTER — Encounter: Payer: Self-pay | Admitting: Internal Medicine

## 2021-01-21 NOTE — Assessment & Plan Note (Signed)
Atypical, etiology unclear, for derm referral

## 2021-01-21 NOTE — Assessment & Plan Note (Signed)

## 2021-01-21 NOTE — Assessment & Plan Note (Signed)
BP Readings from Last 3 Encounters:  01/18/21 (!) 156/74  04/12/20 118/74  01/21/20 140/82   Elevated today, ? reactive, pt to continue to check BP at home daily for 10 days and let us know results  Current Outpatient Medications (Endocrine & Metabolic):  .  estradiol (VIVELLE-DOT) 0.05 MG/24HR patch, Place 1 patch (0.05 mg total) onto the skin 2 (two) times a week. .  progesterone (PROMETRIUM) 100 MG capsule, Take 1 capsule (100 mg total) by mouth daily.      Current Outpatient Medications (Other):  Marland Kitchen  APPLE CIDER VINEGAR PO, Take by mouth. .  Ascorbic Acid (VITAMIN C PO), Take by mouth. .  Cholecalciferol (VITAMIN D3 PO), Take by mouth. .  Multiple Vitamin (MULTIVITAMIN) tablet, Take 1 tablet by mouth daily. Marland Kitchen  Specialty Vitamins Products (BIOTIN PLUS KERATIN) 10000-100 MCG-MG TABS, Take by mouth.

## 2021-01-21 NOTE — Telephone Encounter (Signed)
Ok done

## 2021-01-21 NOTE — Assessment & Plan Note (Signed)
Lab Results  Component Value Date   LDLCALC 107 (H) 01/10/2021   Stable, pt to continue current low chol diet, for ct cardiac score, consider statin if abnormal

## 2021-01-21 NOTE — Addendum Note (Signed)
Addended by: Biagio Borg on: 01/21/2021 01:52 PM   Modules accepted: Orders

## 2021-02-21 ENCOUNTER — Other Ambulatory Visit: Payer: Self-pay

## 2021-02-21 ENCOUNTER — Encounter: Payer: Self-pay | Admitting: Internal Medicine

## 2021-02-21 ENCOUNTER — Ambulatory Visit (INDEPENDENT_AMBULATORY_CARE_PROVIDER_SITE_OTHER)
Admission: RE | Admit: 2021-02-21 | Discharge: 2021-02-21 | Disposition: A | Discharge status: Home / Self Care | Payer: Self-pay | Source: Ambulatory Visit | Attending: Internal Medicine | Admitting: Internal Medicine

## 2021-02-21 DIAGNOSIS — I8390 Asymptomatic varicose veins of unspecified lower extremity: Secondary | ICD-10-CM

## 2021-04-13 ENCOUNTER — Encounter: Admitting: Obstetrics and Gynecology

## 2021-04-13 ENCOUNTER — Encounter: Payer: Self-pay | Admitting: Obstetrics & Gynecology

## 2021-04-13 ENCOUNTER — Ambulatory Visit (INDEPENDENT_AMBULATORY_CARE_PROVIDER_SITE_OTHER): Admitting: Obstetrics & Gynecology

## 2021-04-13 ENCOUNTER — Other Ambulatory Visit (HOSPITAL_COMMUNITY)
Admission: RE | Admit: 2021-04-13 | Discharge: 2021-04-13 | Disposition: A | Discharge status: Home / Self Care | Source: Ambulatory Visit | Attending: Obstetrics & Gynecology | Admitting: Obstetrics & Gynecology

## 2021-04-13 ENCOUNTER — Other Ambulatory Visit: Payer: Self-pay

## 2021-04-13 VITALS — BP 140/80 | HR 72 | Resp 16 | Ht 64.25 in | Wt 138.0 lb

## 2021-04-13 DIAGNOSIS — R87610 Atypical squamous cells of undetermined significance on cytologic smear of cervix (ASC-US): Secondary | ICD-10-CM | POA: Insufficient documentation

## 2021-04-13 DIAGNOSIS — Z01419 Encounter for gynecological examination (general) (routine) without abnormal findings: Secondary | ICD-10-CM | POA: Insufficient documentation

## 2021-04-13 DIAGNOSIS — M8580 Other specified disorders of bone density and structure, unspecified site: Secondary | ICD-10-CM | POA: Diagnosis not present

## 2021-04-13 DIAGNOSIS — Z79890 Hormone replacement therapy (postmenopausal): Secondary | ICD-10-CM

## 2021-04-13 MED ORDER — ESTRADIOL 0.0375 MG/24HR TD PTTW: 1.0000 | MEDICATED_PATCH | TRANSDERMAL | 4 refills | Status: DC

## 2021-04-13 MED ORDER — PROGESTERONE MICRONIZED 100 MG PO CAPS: 100.0000 mg | ORAL_CAPSULE | Freq: Every day | ORAL | 4 refills | Status: DC

## 2021-04-13 NOTE — Progress Notes (Signed)
LUCEY CONSER 04/19/1956 [id number]   History:    65 y.o. G2P2L2  RP:  Established patient presenting for annual gyn exam   HPI: Postmenopausal/HRT.  Continues on Vivelle 0.05 mg patch and Prometrium 100 mg daily with good control of hot flash symptoms.  Has recurrence of symptoms if she happens to miss a dose.  She wants to continue on the same dosages.  No PMB.  No pelvic pain.  Pap smear 2020 and 2018 showed ASCUS negative high-risk HPV.  Negative colposcopy with ECC showing benign polyp 04/2019.  Breasts normal.  Colonoscopy 2016.  DEXA 05/2019  T score -2.0.  FRAX 9.8% / 1.3.  Is supplementing with vitamin D and calcium.  Health labs with Fam MD. BMI 23.5.  Past medical history,surgical history, family history and social history were all reviewed and documented in the EPIC chart.  Gynecologic History Patient's last menstrual period was 03/08/2005.  Obstetric History OB History  Gravida Para Term Preterm AB Living  '2 2       2  '$ SAB IAB Ectopic Multiple Live Births               # Outcome Date GA Lbr Len/2nd Weight Sex Delivery Anes PTL Lv  2 Para           1 Para              ROS: A ROS was performed and pertinent positives and negatives are included in the history.  GENERAL: No fevers or chills. HEENT: No change in vision, no earache, sore throat or sinus congestion. NECK: No pain or stiffness. CARDIOVASCULAR: No chest pain or pressure. No palpitations. PULMONARY: No shortness of breath, cough or wheeze. GASTROINTESTINAL: No abdominal pain, nausea, vomiting or diarrhea, melena or bright red blood per rectum. GENITOURINARY: No urinary frequency, urgency, hesitancy or dysuria. MUSCULOSKELETAL: No joint or muscle pain, no back pain, no recent trauma. DERMATOLOGIC: No rash, no itching, no lesions. ENDOCRINE: No polyuria, polydipsia, no heat or cold intolerance. No recent change in weight. HEMATOLOGICAL: No anemia or easy bruising or bleeding. NEUROLOGIC: No headache, seizures,  numbness, tingling or weakness. PSYCHIATRIC: No depression, no loss of interest in normal activity or change in sleep pattern.     Exam:   BP 140/80   Pulse 72   Resp 16   Ht 5' 4.25" (1.632 m)   Wt 138 lb (62.6 kg)   LMP 03/08/2005   BMI 23.50 kg/m   Body mass index is 23.5 kg/m.  General appearance : Well developed well nourished female. No acute distress HEENT: Eyes: no retinal hemorrhage or exudates,  Neck supple, trachea midline, no carotid bruits, no thyroidmegaly Lungs: Clear to auscultation, no rhonchi or wheezes, or rib retractions  Heart: Regular rate and rhythm, no murmurs or gallops Breast:Examined in sitting and supine position were symmetrical in appearance, no palpable masses or tenderness,  no skin retraction, no nipple inversion, no nipple discharge, no skin discoloration, no axillary or supraclavicular lymphadenopathy Abdomen: no palpable masses or tenderness, no rebound or guarding Extremities: no edema or skin discoloration or tenderness  Pelvic: Vulva: Normal             Vagina: No gross lesions or discharge  Cervix: No gross lesions or discharge.  Pap reflex done.  Uterus  AV, normal size, shape and consistency, non-tender and mobile  Adnexa  Without masses or tenderness  Anus: Normal   Assessment/Plan:  65 y.o. female for annual exam  1. Encounter for routine gynecological examination with Papanicolaou smear of cervix Normal gynecologic exam in menopause.  Pap reflex done.  Breast exam normal.  Screening mammography September 2021 was negative.  Colonoscopy 2016.  Body mass index 23.5.  Continue with fitness and healthy nutrition.  Health labs with family physician. - Cytology - PAP( [place])  2. ASCUS of cervix with negative high risk HPV - Cytology - PAP( [place])  3. Postmenopausal hormone replacement therapy Well on the hormone replacement therapy.  Risk benefits reviewed with patient and decision to decrease the dose of estradiol to  0.0375 twice a week.  Progesterone 100 mg per mouth daily at bedtime.  Prescription sent to pharmacy.  4. Osteopenia, unspecified location Schedule bone density here now.  Vitamin D supplements, calcium intake of 1.5 g/day total and regular weightbearing physical activity is recommended. - DG Bone Density; Future  Other orders - progesterone (PROMETRIUM) 100 MG capsule; Take 1 capsule (100 mg total) by mouth at bedtime. - estradiol (VIVELLE-DOT) 0.0375 MG/24HR; Place 1 patch onto the skin 2 (two) times a week.   Princess Bruins MD, 1:54 PM 04/13/2021

## 2021-04-15 ENCOUNTER — Encounter: Payer: Self-pay | Admitting: Obstetrics & Gynecology

## 2021-04-16 LAB — CYTOLOGY - PAP: Diagnosis: NEGATIVE

## 2021-05-14 LAB — HM MAMMOGRAPHY

## 2021-05-25 ENCOUNTER — Encounter: Payer: Self-pay | Admitting: Obstetrics & Gynecology

## 2021-05-29 ENCOUNTER — Ambulatory Visit (INDEPENDENT_AMBULATORY_CARE_PROVIDER_SITE_OTHER)

## 2021-05-29 ENCOUNTER — Other Ambulatory Visit: Payer: Self-pay | Admitting: Obstetrics & Gynecology

## 2021-05-29 ENCOUNTER — Other Ambulatory Visit: Payer: Self-pay

## 2021-05-29 DIAGNOSIS — Z780 Asymptomatic menopausal state: Secondary | ICD-10-CM

## 2021-05-29 DIAGNOSIS — M85851 Other specified disorders of bone density and structure, right thigh: Secondary | ICD-10-CM | POA: Diagnosis not present

## 2021-05-29 DIAGNOSIS — M85852 Other specified disorders of bone density and structure, left thigh: Secondary | ICD-10-CM | POA: Diagnosis not present

## 2021-05-29 DIAGNOSIS — M8580 Other specified disorders of bone density and structure, unspecified site: Secondary | ICD-10-CM

## 2021-10-02 ENCOUNTER — Ambulatory Visit (INDEPENDENT_AMBULATORY_CARE_PROVIDER_SITE_OTHER): Admitting: Ophthalmology

## 2021-10-02 ENCOUNTER — Encounter (INDEPENDENT_AMBULATORY_CARE_PROVIDER_SITE_OTHER): Payer: Self-pay | Admitting: Ophthalmology

## 2021-10-02 ENCOUNTER — Other Ambulatory Visit: Payer: Self-pay

## 2021-10-02 DIAGNOSIS — H43822 Vitreomacular adhesion, left eye: Secondary | ICD-10-CM

## 2021-10-02 DIAGNOSIS — H43811 Vitreous degeneration, right eye: Secondary | ICD-10-CM | POA: Diagnosis not present

## 2021-10-02 DIAGNOSIS — H2513 Age-related nuclear cataract, bilateral: Secondary | ICD-10-CM

## 2021-10-02 DIAGNOSIS — H43812 Vitreous degeneration, left eye: Secondary | ICD-10-CM

## 2021-10-02 NOTE — Assessment & Plan Note (Signed)

## 2021-10-02 NOTE — Progress Notes (Signed)
10/02/2021     CHIEF COMPLAINT Patient presents for  Chief Complaint  Patient presents with   Retina Follow Up      HISTORY OF PRESENT ILLNESS: Joanne Prince is a 66 y.o. female who presents to the clinic today for:   HPI     Retina Follow Up           Diagnosis: Other   Laterality: both eyes   Onset: 1 year ago   Severity: mild   Duration: 1 year   Course: gradually worsening         Comments   1 year fu ou and OCT  Pt states, "My vision seems to be more blurred up close. I have had a few episodes of flashes of light. They do not last long and I seem to see the more in the morning when I am getting in the car and it is dark. I think it is in my right eye."        Last edited by Kendra Opitz, COA on 10/02/2021  1:01 PM.      Referring physician: Biagio Borg, MD [place],  [place] [address]  HISTORICAL INFORMATION:   Selected notes from the MEDICAL RECORD NUMBER    Lab Results  Component Value Date   HGBA1C 5.7 01/10/2021     CURRENT MEDICATIONS: No current outpatient medications on file. (Ophthalmic Drugs)   No current facility-administered medications for this visit. (Ophthalmic Drugs)   Current Outpatient Medications (Other)  Medication Sig   Ascorbic Acid (VITAMIN C PO) Take by mouth.   Cholecalciferol (VITAMIN D3 PO) Take by mouth.   estradiol (VIVELLE-DOT) 0.0375 MG/24HR Place 1 patch onto the skin 2 (two) times a week.   Multiple Vitamin (MULTIVITAMIN) tablet Take 1 tablet by mouth daily.   progesterone (PROMETRIUM) 100 MG capsule Take 1 capsule (100 mg total) by mouth at bedtime.   No current facility-administered medications for this visit. (Other)      REVIEW OF SYSTEMS:    ALLERGIES No Known Allergies  PAST MEDICAL HISTORY Past Medical History:  Diagnosis Date   ALLERGIC RHINITIS 07/01/2007   Qualifier: Diagnosis of  By: Jenny Reichmann MD, Hunt Oris    ASCUS of cervix with negative high risk HPV 03/2017, 04/2019    [place] DISEASE, LUMBAR 07/01/2007   Qualifier: Diagnosis of  By: Jenny Reichmann MD, [place], HX OF 07/01/2007   Qualifier: Diagnosis of  By: Jenny Reichmann MD, Hunt Oris    Heart murmur    History of indigestion    History of kidney stones    HYPERLIPIDEMIA 07/26/2010   Qualifier: Diagnosis of  By: Jenny Reichmann MD, Hunt Oris    Kidney stone 01/13/2020   Kidney stones    MITRAL VALVE PROLAPSE, HX OF 07/01/2007   Qualifier: Diagnosis of  By: Jenny Reichmann MD, [place], HX OF 10/28/2007   Qualifier: Diagnosis of  By: Linda Hedges MD, Worthy Rancher, LOCAL NOS, SHOULDER 07/01/2007   Qualifier: Diagnosis of  By: Jenny Reichmann MD, Hunt Oris    OSTEOPENIA 05/2019   T score -2.0 FRAX 9.8% / 1.3% stable from prior DEXA   Past Surgical History:  Procedure Laterality Date   cholecystectomy     CYSTOSCOPY WITH RETROGRADE PYELOGRAM, URETEROSCOPY AND STENT PLACEMENT Left 11/06/2018   Procedure: CYSTOSCOPY WITH RETROGRADE PYELOGRAM, URETEROSCOPY AND STENT PLACEMENT;  Surgeon: Alexis Frock, MD;  Location: [facility];  Service: Urology;  Laterality: Left;   HOLMIUM LASER APPLICATION Left 05/05/8181   Procedure: HOLMIUM LASER APPLICATION;  Surgeon: Alexis Frock, MD;  Location: [facility];  Service: Urology;  Laterality: Left;   LASIX Bilateral    uterine polypectomy     WISDOM TOOTH EXTRACTION      FAMILY HISTORY Family History  Problem Relation Age of Onset   Hypertension Mother    Celiac disease Maternal Uncle     SOCIAL HISTORY Social History   Tobacco Use   Smoking status: Never   Smokeless tobacco: Never  Vaping Use   Vaping Use: Never used  Substance Use Topics   Alcohol use: Yes    Comment: occ   Drug use: No         OPHTHALMIC EXAM:  Base Eye Exam     Visual Acuity (ETDRS)       Right Left   Dist [place] 20/20 -1 20/40 -1   Dist ph [place]  20/20         Tonometry (Tonopen, 1:04 PM)       Right Left   Pressure 18 16         Pupils        Pupils Dark Light Shape React APD   Right PERRL 4 3 Round Brisk None   Left PERRL 4 3 Round Brisk None         Visual Fields (Counting fingers)       Left Right    Full Full         Extraocular Movement       Right Left    Full, Ortho Full, Ortho         Neuro/Psych     Oriented x3: Yes   Mood/Affect: Normal         Dilation     Both eyes: 1.0% Mydriacyl, 2.5% Phenylephrine @ 1:04 PM           Slit Lamp and Fundus Exam     External Exam       Right Left   External Normal Normal         Slit Lamp Exam       Right Left   Lids/Lashes Normal Normal   Conjunctiva/Sclera White and quiet White and quiet   Cornea Clear Clear   Anterior Chamber Deep and quiet Deep and quiet   Iris Round and reactive Round and reactive   Lens 1+ Nuclear sclerosis 1+ Nuclear sclerosis   Anterior Vitreous Normal Posterior vitreous detachment         Fundus Exam       Right Left   Posterior Vitreous Posterior vitreous detachment Posterior vitreous detachment   Disc Normal Normal   C/D Ratio 0.4 0.5   Macula Normal Normal   Vessels Normal Normal   Periphery Normal, peripheral pigmentation in the ora serrata nearly 360 but no retinal holes or break, scleral depression 25 diopter lens No retinal holes or tears, pigment at 2, no break            IMAGING AND PROCEDURES  Imaging and Procedures for 10/02/21  OCT, Retina - OU - Both Eyes       Right Eye Quality was good. Scan locations included subfoveal. Central Foveal Thickness: 285. Progression has been stable. Findings include normal foveal contour.   Left Eye Quality was good. Scan locations included subfoveal. Central Foveal Thickness: 282. Progression has been stable. Findings include normal foveal contour.  Notes Incidental posterior vitreous detachment             ASSESSMENT/PLAN:  Posterior vitreous detachment of right eye  The nature of posterior vitreous detachment was discussed with the  patient as well as its physiology, its age prevalence, and its possible implication regarding retinal breaks and detachment.  An informational brochure was offered to the patient.  All the patient's questions were answered.  The patient was asked to return if new or different flashes or floaters develops.   Patient was instructed to contact office immediately if any new changes were noticed. I explained to the patient that vitreous inside the eye is similar to jello inside a bowl. As the jello melts it can start to pull away from the bowl, similarly the vitreous throughout our lives can begin to pull away from the retina. That process is called a posterior vitreous detachment. In some cases, the vitreous can tug hard enough on the retina to form a retinal tear. I discussed with the patient the signs and symptoms of a retinal detachment.  Do not rub the eye.    Nuclear sclerotic cataract of both eyes The nature of cataract was discussed with the patient as well as the elective nature of surgery. The patient was reassured that surgery at a later date does not put the patient at risk for a worse outcome. It was emphasized that the need for surgery is dictated by the patient's quality of life as influenced by the cataract. Patient was instructed to maintain close follow up with their general eye care doctor.      ICD-10-CM   1. Posterior vitreous detachment of right eye  H43.811 OCT, Retina - OU - Both Eyes    2. Posterior vitreous detachment of left eye  H43.812 OCT, Retina - OU - Both Eyes    3. Vitreomacular adhesion of left eye  H43.822     4. Nuclear sclerotic cataract of both eyes  H25.13       1.  Posterior vitreous detachment, OU, no retinal holes or tears  2.  Mild nuclear sclerotic cataracts in each eye moderate impact with night vision at this time  3.  Ophthalmic Meds Ordered this visit:  No orders of the defined types were placed in this encounter.      Return in about 1 year  (around 10/02/2022) for DILATE OU, COLOR FP, OCT.  There are no Patient Instructions on file for this visit.   Explained the diagnoses, plan, and follow up with the patient and they expressed understanding.  Patient expressed understanding of the importance of proper follow up care.   Clent Demark Artem Bunte M.D. Diseases & Surgery of the Retina and Vitreous Retina & Diabetic [place] 10/02/21     Abbreviations: M myopia (nearsighted); A astigmatism; H hyperopia (farsighted); P presbyopia; Mrx spectacle prescription;  CTL contact lenses; OD right eye; OS left eye; OU both eyes  XT exotropia; ET esotropia; PEK punctate epithelial keratitis; PEE punctate epithelial erosions; DES dry eye syndrome; MGD meibomian gland dysfunction; ATs artificial tears; PFAT's preservative free artificial tears; [place] nuclear sclerotic cataract; PSC posterior subcapsular cataract; ERM epi-retinal membrane; PVD posterior vitreous detachment; RD retinal detachment; DM diabetes mellitus; DR diabetic retinopathy; NPDR non-proliferative diabetic retinopathy; PDR proliferative diabetic retinopathy; CSME clinically significant macular edema; DME diabetic macular edema; dbh dot blot hemorrhages; CWS cotton wool spot; POAG primary open angle glaucoma; C/D cup-to-disc ratio; HVF humphrey visual field; GVF goldmann visual field; OCT optical coherence tomography;  IOP intraocular pressure; BRVO Branch retinal vein occlusion; CRVO central retinal vein occlusion; CRAO central retinal artery occlusion; BRAO branch retinal artery occlusion; RT retinal tear; SB scleral buckle; PPV pars plana vitrectomy; VH Vitreous hemorrhage; PRP panretinal laser photocoagulation; IVK intravitreal kenalog; VMT vitreomacular traction; MH Macular hole;  NVD neovascularization of the disc; NVE neovascularization elsewhere; AREDS age related eye disease study; ARMD age related macular degeneration; POAG primary open angle glaucoma; EBMD epithelial/anterior basement  membrane dystrophy; ACIOL anterior chamber intraocular lens; IOL intraocular lens; PCIOL posterior chamber intraocular lens; Phaco/IOL phacoemulsification with intraocular lens placement; [place] photorefractive keratectomy; LASIK laser assisted in situ keratomileusis; HTN hypertension; DM diabetes mellitus; COPD chronic obstructive pulmonary disease

## 2021-10-02 NOTE — Assessment & Plan Note (Signed)

## 2021-11-01 ENCOUNTER — Encounter (INDEPENDENT_AMBULATORY_CARE_PROVIDER_SITE_OTHER): Admitting: Ophthalmology

## 2022-01-18 ENCOUNTER — Other Ambulatory Visit (INDEPENDENT_AMBULATORY_CARE_PROVIDER_SITE_OTHER)

## 2022-01-18 DIAGNOSIS — R739 Hyperglycemia, unspecified: Secondary | ICD-10-CM

## 2022-01-18 DIAGNOSIS — E7800 Pure hypercholesterolemia, unspecified: Secondary | ICD-10-CM

## 2022-01-18 LAB — BASIC METABOLIC PANEL
BUN: 12 mg/dL (ref 6–23)
CO2: 27 mEq/L (ref 19–32)
Calcium: 9.3 mg/dL (ref 8.4–10.5)
Chloride: 103 mEq/L (ref 96–112)
Creatinine, Ser: 0.87 mg/dL (ref 0.40–1.20)
GFR: 70.36 mL/min (ref >60.00)
Glucose, Bld: 106 mg/dL — ABNORMAL HIGH (ref 70–99)
Potassium: 4.7 mEq/L (ref 3.5–5.1)
Sodium: 138 mEq/L (ref 135–145)

## 2022-01-18 LAB — CBC WITH DIFFERENTIAL/PLATELET
Basophils Absolute: 0.1 10*3/uL (ref 0.0–0.1)
Basophils Relative: 1.3 % (ref 0.0–3.0)
Eosinophils Absolute: 0.1 10*3/uL (ref 0.0–0.7)
Eosinophils Relative: 1.3 % (ref 0.0–5.0)
HCT: 43.2 % (ref 36.0–46.0)
Hemoglobin: 14.7 g/dL (ref 12.0–15.0)
Lymphocytes Relative: 29 % (ref 12.0–46.0)
Lymphs Abs: 1.2 10*3/uL (ref 0.7–4.0)
MCHC: 34 g/dL (ref 30.0–36.0)
MCV: 90.7 fl (ref 78.0–100.0)
Monocytes Absolute: 0.3 10*3/uL (ref 0.1–1.0)
Monocytes Relative: 7.2 % (ref 3.0–12.0)
Neutro Abs: 2.6 10*3/uL (ref 1.4–7.7)
Neutrophils Relative %: 61.2 % (ref 43.0–77.0)
Platelets: 232 10*3/uL (ref 150.0–400.0)
RBC: 4.77 Mil/uL (ref 3.87–5.11)
RDW: 12.8 % (ref 11.5–15.5)
WBC: 4.3 10*3/uL (ref 4.0–10.5)

## 2022-01-18 LAB — LIPID PANEL
Cholesterol: 177 mg/dL (ref 0–200)
HDL: 62.1 mg/dL (ref >39.00)
LDL Cholesterol: 101 mg/dL — ABNORMAL HIGH (ref 0–99)
NonHDL: 115.19
Total CHOL/HDL Ratio: 3
Triglycerides: 73 mg/dL (ref 0.0–149.0)
VLDL: 14.6 mg/dL (ref 0.0–40.0)

## 2022-01-18 LAB — URINALYSIS, ROUTINE W REFLEX MICROSCOPIC
Bilirubin Urine: NEGATIVE
Hgb urine dipstick: NEGATIVE
Ketones, ur: NEGATIVE
Leukocytes,Ua: NEGATIVE
Nitrite: NEGATIVE
Specific Gravity, Urine: <=1.005 — AB (ref 1.000–1.030)
Total Protein, Urine: NEGATIVE
Urine Glucose: NEGATIVE
Urobilinogen, UA: 0.2 (ref 0.0–1.0)
WBC, UA: NONE SEEN (ref 0–?)
pH: 6.5 (ref 5.0–8.0)

## 2022-01-18 LAB — HEPATIC FUNCTION PANEL
ALT: 14 U/L (ref 0–35)
AST: 21 U/L (ref 0–37)
Albumin: 4.4 g/dL (ref 3.5–5.2)
Alkaline Phosphatase: 51 U/L (ref 39–117)
Bilirubin, Direct: 0.3 mg/dL (ref 0.0–0.3)
Total Bilirubin: 1.5 mg/dL — ABNORMAL HIGH (ref 0.2–1.2)
Total Protein: 7.5 g/dL (ref 6.0–8.3)

## 2022-01-18 LAB — HEMOGLOBIN A1C: Hgb A1c MFr Bld: 5.6 % (ref 4.6–6.5)

## 2022-01-18 LAB — TSH: TSH: 1.29 u[IU]/mL (ref 0.35–5.50)

## 2022-01-21 ENCOUNTER — Ambulatory Visit (INDEPENDENT_AMBULATORY_CARE_PROVIDER_SITE_OTHER): Admitting: Internal Medicine

## 2022-01-21 VITALS — BP 126/74 | HR 83 | Temp 98.8°F | Ht 64.0 in | Wt 134.0 lb

## 2022-01-21 DIAGNOSIS — R739 Hyperglycemia, unspecified: Secondary | ICD-10-CM | POA: Diagnosis not present

## 2022-01-21 DIAGNOSIS — R030 Elevated blood-pressure reading, without diagnosis of hypertension: Secondary | ICD-10-CM

## 2022-01-21 DIAGNOSIS — E559 Vitamin D deficiency, unspecified: Secondary | ICD-10-CM

## 2022-01-21 DIAGNOSIS — Z0000 Encounter for general adult medical examination without abnormal findings: Secondary | ICD-10-CM

## 2022-01-21 DIAGNOSIS — E7800 Pure hypercholesterolemia, unspecified: Secondary | ICD-10-CM | POA: Diagnosis not present

## 2022-01-21 DIAGNOSIS — E538 Deficiency of other specified B group vitamins: Secondary | ICD-10-CM

## 2022-01-21 NOTE — Assessment & Plan Note (Signed)
Lab Results  Component Value Date   LDLCALC 101 (H) 01/18/2022   Slightly elevated only, goal ldl < 100, pt to continue current low chol diet

## 2022-01-21 NOTE — Assessment & Plan Note (Signed)
Lab Results  Component Value Date   HGBA1C 5.6 01/18/2022   Stable, pt to continue current medical treatment  - diet

## 2022-01-21 NOTE — Progress Notes (Signed)
Joanne Prince, female   DOB: 22-Sep-1955, 66 y.o.   MRN: [id number]         Chief Complaint:: wellness exam        HPI:  Joanne Prince is a 66 y.o. female here for wellness exam; decliens covid booster, o/w up to date                        Also Pt denies chest pain, increased sob or doe, wheezing, orthopnea, PND, increased LE swelling, palpitations, dizziness or syncope.   Pt denies polydipsia, polyuria, or new focal neuro s/s.    Pt denies fever, wt loss, night sweats, loss of appetite, or other constitutional symptoms.  Has hx of basal cell ca - none recent, no new skin lesions.   Wt Readings from Last 3 Encounters:  01/21/22 134 lb (60.8 kg)  04/13/21 138 lb (62.6 kg)  01/18/21 140 lb (63.5 kg)   BP Readings from Last 3 Encounters:  01/21/22 126/74  04/13/21 140/80  01/18/21 (!) 156/74   Immunization History  Administered Date(s) Administered   Influenza Inj Mdck Quad Pf 05/26/2019   Influenza Split 06/09/2012, 04/14/2016   Influenza Whole 06/21/2008, 07/25/2009   Influenza,inj,Quad PF,6+ Mos 07/10/2013, 06/16/2014, 04/29/2017, 04/27/2018   Influenza-Unspecified 06/27/2015, 04/29/2017   PFIZER(Purple Top)SARS-COV-2 Vaccination 10/29/2019, 11/19/2019, 07/28/2020   Td 09/02/2001, 07/03/2010   Tdap 03/08/2015   Zoster Recombinat (Shingrix) 04/29/2017, 12/18/2017, 12/19/2017   There are no preventive care reminders to display for this Joanne.     Past Medical History:  Diagnosis Date   ALLERGIC RHINITIS 07/01/2007   Qualifier: Diagnosis of  By: Jenny Reichmann MD, Hunt Oris    ASCUS of cervix with negative high risk HPV 03/2017, 04/2019   [place] DISEASE, LUMBAR 07/01/2007   Qualifier: Diagnosis of  By: Jenny Reichmann MD, [place], HX OF 07/01/2007   Qualifier: Diagnosis of  By: Jenny Reichmann MD, Hunt Oris    Heart murmur    History of indigestion    History of kidney stones    HYPERLIPIDEMIA 07/26/2010   Qualifier: Diagnosis of  By: Jenny Reichmann MD, Hunt Oris    Kidney stone 01/13/2020    Kidney stones    MITRAL VALVE PROLAPSE, HX OF 07/01/2007   Qualifier: Diagnosis of  By: Jenny Reichmann MD, [place], HX OF 10/28/2007   Qualifier: Diagnosis of  By: Linda Hedges MD, Worthy Rancher, LOCAL NOS, SHOULDER 07/01/2007   Qualifier: Diagnosis of  By: Jenny Reichmann MD, Hunt Oris    OSTEOPENIA 05/2019   T score -2.0 FRAX 9.8% / 1.3% stable from prior DEXA   Past Surgical History:  Procedure Laterality Date   cholecystectomy     CYSTOSCOPY WITH RETROGRADE PYELOGRAM, URETEROSCOPY AND STENT PLACEMENT Left 11/06/2018   Procedure: CYSTOSCOPY WITH RETROGRADE PYELOGRAM, URETEROSCOPY AND STENT PLACEMENT;  Surgeon: Alexis Frock, MD;  Location: [facility];  Service: Urology;  Laterality: Left;   HOLMIUM LASER APPLICATION Left 10/09/621   Procedure: HOLMIUM LASER APPLICATION;  Surgeon: Alexis Frock, MD;  Location: [facility];  Service: Urology;  Laterality: Left;   LASIX Bilateral    uterine polypectomy     WISDOM TOOTH EXTRACTION      reports that she has never smoked. She has never used smokeless tobacco. She reports current alcohol use. She reports that she does not use drugs. family history includes Celiac disease in her maternal uncle; Hypertension in  her mother. No Known Allergies Current Outpatient Medications on File Prior to Visit  Medication Sig Dispense Refill   Ascorbic Acid (VITAMIN C PO) Take by mouth.     Cholecalciferol (VITAMIN D3 PO) Take by mouth.     estradiol (VIVELLE-DOT) 0.0375 MG/24HR Place 1 patch onto the skin 2 (two) times a week. 24 patch 4   Multiple Vitamin (MULTIVITAMIN) tablet Take 1 tablet by mouth daily.     niacinamide 500 MG tablet Take 500 mg by mouth 2 (two) times daily with a meal.     progesterone (PROMETRIUM) 100 MG capsule Take 1 capsule (100 mg total) by mouth at bedtime. 90 capsule 4   No current facility-administered medications on file prior to visit.        ROS:  All others reviewed and  negative.  Objective        PE:  BP 126/74 (BP Location: Right Arm, Joanne Position: Sitting, Cuff Size: Large)   Pulse 83   Temp 98.8 F (37.1 C) (Oral)   Ht '5\' 4"'$  (1.626 m)   Wt 134 lb (60.8 kg)   LMP 03/08/2005   SpO2 99%   BMI 23.00 kg/m                 Constitutional: Pt appears in NAD               HENT: Head: NCAT.                Right Ear: External ear normal.                 Left Ear: External ear normal.                Eyes: . Pupils are equal, round, and reactive to light. Conjunctivae and EOM are normal               Nose: without d/c or deformity               Neck: Neck supple. Gross normal ROM               Cardiovascular: Normal rate and regular rhythm.                 Pulmonary/Chest: Effort normal and breath sounds without rales or wheezing.                Abd:  Soft, NT, ND, + BS, no organomegaly               Neurological: Pt is alert. At baseline orientation, motor grossly intact               Skin: Skin is warm. No rashes, no other new lesions, LE edema - none               Psychiatric: Pt behavior is normal without agitation   Micro: none  Cardiac tracings I have personally interpreted today:  none  Pertinent Radiological findings (summarize): none   Lab Results  Component Value Date   WBC 4.3 01/18/2022   HGB 14.7 01/18/2022   HCT 43.2 01/18/2022   PLT 232.0 01/18/2022   GLUCOSE 106 (H) 01/18/2022   CHOL 177 01/18/2022   TRIG 73.0 01/18/2022   HDL 62.10 01/18/2022   LDLDIRECT 113.0 01/13/2020   LDLCALC 101 (H) 01/18/2022   ALT 14 01/18/2022   AST 21 01/18/2022   NA 138 01/18/2022   K 4.7 01/18/2022   CL 103 01/18/2022  CREATININE 0.87 01/18/2022   BUN 12 01/18/2022   CO2 27 01/18/2022   TSH 1.29 01/18/2022   HGBA1C 5.6 01/18/2022   Assessment/Plan:  Joanne Prince is a 67 y.o. White or Caucasian [1] female with  has a past medical history of ALLERGIC RHINITIS (07/01/2007), ASCUS of cervix with negative high risk HPV (03/2017, 04/2019),  [place] DISEASE, LUMBAR (07/01/2007), GENITAL HERPES, HX OF (07/01/2007), Heart murmur, History of indigestion, History of kidney stones, HYPERLIPIDEMIA (07/26/2010), Kidney stone (01/13/2020), Kidney stones, MITRAL VALVE PROLAPSE, HX OF (07/01/2007), NEPHROLITHIASIS, HX OF (10/28/2007), OSTEOARTHROSIS, LOCAL NOS, SHOULDER (07/01/2007), and OSTEOPENIA (05/2019).  Preventative health care Age and sex appropriate education and counseling updated with regular exercise and diet Referrals for preventative services - none needed Immunizations addressed - declines covid booster Smoking counseling  - none needed Evidence for depression or other mood disorder - none significant Most recent labs reviewed. I have personally reviewed and have noted: 1) the Joanne's medical and social history 2) The Joanne's current medications and supplements 3) The Joanne's height, weight, and BMI have been recorded in the chart   Blood pressure elevated without history of HTN Improved with diet and wt loss,  to f/u any worsening symptoms or concerns  HLD (hyperlipidemia) Lab Results  Component Value Date   LDLCALC 101 (H) 01/18/2022   Slightly elevated only, goal ldl < 100, pt to continue current low chol diet   Hyperglycemia Lab Results  Component Value Date   HGBA1C 5.6 01/18/2022   Stable, pt to continue current medical treatment  - diet  Followup: No follow-ups on file.  Cathlean Cower, MD 01/21/2022 2:32 PM [facility]

## 2022-01-21 NOTE — Assessment & Plan Note (Signed)
Improved with diet and wt loss,  to f/u any worsening symptoms or concerns

## 2022-01-21 NOTE — Assessment & Plan Note (Signed)

## 2022-01-21 NOTE — Patient Instructions (Signed)
Please continue all other medications as before, and refills have been done if requested.  Please have the pharmacy call with any other refills you may need.  Please continue your efforts at being more active, low cholesterol diet, and weight control.  You are otherwise up to date with prevention measures today.  Please keep your appointments with your specialists as you may have planned  Please make an Appointment to return for your 1 year visit, or sooner if needed, with Lab testing by Appointment as well, to be done about 3-5 days before at the [facility] (so this is for TWO appointments - please see the scheduling desk as you leave)   Due to the ongoing Covid 19 pandemic, our lab now requires an appointment for any labs done at our office.  If you need labs done and do not have an appointment, please call our office ahead of time to schedule before presenting to the lab for your testing.

## 2022-02-04 ENCOUNTER — Telehealth (INDEPENDENT_AMBULATORY_CARE_PROVIDER_SITE_OTHER): Admitting: Nurse Practitioner

## 2022-02-04 VITALS — Temp 99.5°F

## 2022-02-04 DIAGNOSIS — J329 Chronic sinusitis, unspecified: Secondary | ICD-10-CM | POA: Diagnosis not present

## 2022-02-04 MED ORDER — BENZONATATE 200 MG PO CAPS
200.0000 mg | ORAL_CAPSULE | Freq: Two times a day (BID) | ORAL | 0 refills | Status: DC | PRN
Start: 2022-02-04 — End: 2022-04-16

## 2022-02-04 MED ORDER — AMOXICILLIN-POT CLAVULANATE 875-125 MG PO TABS: 1.0000 | ORAL_TABLET | Freq: Two times a day (BID) | ORAL | 0 refills | Status: DC

## 2022-02-04 NOTE — Progress Notes (Signed)
   Established Patient Office Visit  An audio-only tele-health visit was completed today for this patient. I connected with  [place] on 02/04/22 utilizing audio-only technology and verified that I am speaking with the correct person using two identifiers. The patient was located at their home, and I was located at the office of [place] [facility] during the encounter. I discussed the limitations of evaluation and management by telemedicine. The patient expressed understanding and agreed to proceed.    Subjective   Patient ID: ANHTHU PERDEW, female    DOB: [birth year]  Age: 66 y.o. MRN: [id number]  Chief Complaint  Patient presents with   Sinusitis    Patient arrives today for telephone visit for the above.  She reports that approximately 4 days ago her symptoms started.  She is experiencing low-grade fever, fatigue, congestion and sore throat, and productive cough.  She denies any shortness of breath or wheezing.  She has been taking over-the-counter Allegra and Flonase without much improvement in her symptoms.  She is also been focusing on fluid intake.  She was able to get a COVID and strep test both which were negative.  She reports that she is going out of town next week and would like to discuss possibly treating with an antibiotic.     Review of Systems  Constitutional:  Positive for fever and malaise/fatigue. Negative for chills.  HENT:  Positive for congestion and sore throat.   Respiratory:  Positive for cough (brown, green) and sputum production. Negative for shortness of breath and wheezing.   Cardiovascular:  Negative for chest pain.  Gastrointestinal:  Negative for abdominal pain, diarrhea, nausea and vomiting.  Musculoskeletal:  Negative for myalgias.  Neurological:  Positive for headaches.     Objective:     Temp 99.5 F (37.5 C)   LMP 03/08/2005    Physical Exam Comprehensive physical exam not completed today as office visit was conducted  remotely.  Patient sounded congested over the phone and she coughed a couple of times.  Patient was alert and oriented, and appeared to have appropriate judgment.   No results found for any visits on 02/04/22.    The 10-year ASCVD risk score (Arnett DK, et al., 2019) is: 7.2%    Assessment & Plan:  Sinusitis: Symptoms seem compatible with acute sinusitis.  Symptoms remain mild.  Negative for COVID and strep.  Recommend symptomatic management with as needed Allegra, Flonase, Tessalon Perles, Robitussin-DM and increase fluid intake as well as rest.  Because she is going out of town will prescribe her Augmentin that she can take in the event symptoms persist for 10 or more days since symptom onset.  She was educated to avoid taking Augmentin any sooner than this if possible.  She reports her understanding.  Problem List Items Addressed This Visit   None Visit Diagnoses     Sinusitis, unspecified chronicity, unspecified location    -  Primary   Relevant Medications   fexofenadine (ALLEGRA) 180 MG tablet   fluticasone (FLONASE) 50 MCG/ACT nasal spray   amoxicillin-clavulanate (AUGMENTIN) 875-125 MG tablet   benzonatate (TESSALON) 200 MG capsule       Return if symptoms worsen or fail to improve.  Total time spent the telephone today was 12 minutes and 29 seconds.   Ailene Ards, NP

## 2022-04-16 ENCOUNTER — Ambulatory Visit (INDEPENDENT_AMBULATORY_CARE_PROVIDER_SITE_OTHER): Admitting: Obstetrics & Gynecology

## 2022-04-16 ENCOUNTER — Other Ambulatory Visit (HOSPITAL_COMMUNITY)
Admission: RE | Admit: 2022-04-16 | Discharge: 2022-04-16 | Disposition: A | Discharge status: Home / Self Care | Source: Ambulatory Visit | Attending: Obstetrics & Gynecology | Admitting: Obstetrics & Gynecology

## 2022-04-16 ENCOUNTER — Encounter: Payer: Self-pay | Admitting: Obstetrics & Gynecology

## 2022-04-16 VITALS — BP 152/80 | HR 96 | Ht 64.75 in | Wt 136.0 lb

## 2022-04-16 DIAGNOSIS — Z79890 Hormone replacement therapy (postmenopausal): Secondary | ICD-10-CM

## 2022-04-16 DIAGNOSIS — M8580 Other specified disorders of bone density and structure, unspecified site: Secondary | ICD-10-CM | POA: Diagnosis not present

## 2022-04-16 DIAGNOSIS — R87610 Atypical squamous cells of undetermined significance on cytologic smear of cervix (ASC-US): Secondary | ICD-10-CM

## 2022-04-16 DIAGNOSIS — Z01419 Encounter for gynecological examination (general) (routine) without abnormal findings: Secondary | ICD-10-CM

## 2022-04-16 MED ORDER — ESTRADIOL 0.0375 MG/24HR TD PTTW: 1.0000 | MEDICATED_PATCH | TRANSDERMAL | 4 refills | Status: DC

## 2022-04-16 MED ORDER — PROGESTERONE MICRONIZED 100 MG PO CAPS
100.0000 mg | ORAL_CAPSULE | Freq: Every day | ORAL | 4 refills | Status: DC
Start: 2022-04-16 — End: 2023-05-14

## 2022-04-16 NOTE — Progress Notes (Signed)
Joanne Prince 1956/07/12 [id number]   History:    66 y.o. G2P2L2   RP:  Established patient presenting for annual gyn exam    HPI: Postmenopausal well on HRT with Vivelle 0.0375 mg patch and Prometrium 100 mg daily HS with good control of hot flash symptoms.  Has recurrence of symptoms if she happens to miss a dose. She wants to continue on the same dosages.  No PMB.  No pelvic pain.  Pap smear 2020 and 2018 showed ASCUS negative high-risk HPV.  Negative colposcopy with ECC showing benign polyp 04/2019.  Pap Neg 04/13/2021.  Pap reflex today.  Breasts normal.  Mammo 05/2021, Rt Dx mammo/US Benign.  Colonoscopy 12/2014. DEXA 05/2021  T score -1.6.  FRAX not increased. Supplementing with vitamin D and calcium. Good fitness.  BMI 22.81. Health labs with Fam MD.    Past medical history,surgical history, family history and social history were all reviewed and documented in the EPIC chart.  Gynecologic History Patient's last menstrual period was 03/08/2005.  Obstetric History OB History  Gravida Para Term Preterm AB Living  '2 2       2  '$ SAB IAB Ectopic Multiple Live Births               # Outcome Date GA Lbr Len/2nd Weight Sex Delivery Anes PTL Lv  2 Para           1 Para              ROS: A ROS was performed and pertinent positives and negatives are included in the history.  GENERAL: No fevers or chills. HEENT: No change in vision, no earache, sore throat or sinus congestion. NECK: No pain or stiffness. CARDIOVASCULAR: No chest pain or pressure. No palpitations. PULMONARY: No shortness of breath, cough or wheeze. GASTROINTESTINAL: No abdominal pain, nausea, vomiting or diarrhea, melena or bright red blood per rectum. GENITOURINARY: No urinary frequency, urgency, hesitancy or dysuria. MUSCULOSKELETAL: No joint or muscle pain, no back pain, no recent trauma. DERMATOLOGIC: No rash, no itching, no lesions. ENDOCRINE: No polyuria, polydipsia, no heat or cold intolerance. No recent change in weight.  HEMATOLOGICAL: No anemia or easy bruising or bleeding. NEUROLOGIC: No headache, seizures, numbness, tingling or weakness. PSYCHIATRIC: No depression, no loss of interest in normal activity or change in sleep pattern.     Exam:   BP (!) 152/80   Pulse 96   Ht 5' 4.75" (1.645 m)   Wt 136 lb (61.7 kg)   LMP 03/08/2005   SpO2 97%   BMI 22.81 kg/m   Body mass index is 22.81 kg/m.  General appearance : Well developed well nourished female. No acute distress HEENT: Eyes: no retinal hemorrhage or exudates,  Neck supple, trachea midline, no carotid bruits, no thyroidmegaly Lungs: Clear to auscultation, no rhonchi or wheezes, or rib retractions  Heart: Regular rate and rhythm, no murmurs or gallops Breast:Examined in sitting and supine position were symmetrical in appearance, no palpable masses or tenderness,  no skin retraction, no nipple inversion, no nipple discharge, no skin discoloration, no axillary or supraclavicular lymphadenopathy Abdomen: no palpable masses or tenderness, no rebound or guarding Extremities: no edema or skin discoloration or tenderness  Pelvic: Vulva: Normal             Vagina: No gross lesions or discharge  Cervix: No gross lesions or discharge.  Pap reflex done.  Uterus  AV, normal size, shape and consistency, non-tender and mobile  Adnexa  Without masses or tenderness  Anus: Normal   Assessment/Plan:  66 y.o. female for annual exam   1. Encounter for routine gynecological examination with Papanicolaou smear of cervix Postmenopausal well on HRT with Vivelle 0.05 mg patch and Prometrium 100 mg daily HS with good control of hot flash symptoms.  Has recurrence of symptoms if she happens to miss a dose. She wants to continue on the same dosages.  No PMB.  No pelvic pain.  Pap smear 2020 and 2018 showed ASCUS negative high-risk HPV.  Negative colposcopy with ECC showing benign polyp 04/2019.  Pap Neg 04/13/2021.  Pap reflex today.  Breasts normal.  Mammo 05/2021, Rt Dx  mammo/US Benign.  Colonoscopy 12/2014.  DEXA 05/2021  T score -1.6.  FRAX not increased. Supplementing with vitamin D and calcium. Good fitness.  BMI 22.81. Health labs with Fam MD.  - Cytology - PAP( [place])  2. ASCUS of cervix with negative high risk HPV - Cytology - PAP( [place])  3. Postmenopausal hormone replacement therapy Postmenopausal well on HRT with Vivelle 0.05 mg patch and Prometrium 100 mg daily HS with good control of hot flash symptoms.  Has recurrence of symptoms if she happens to miss a dose. She wants to continue on the same dosages.  No PMB.  No pelvic pain.   4. Osteopenia, unspecified location DEXA 05/2021  T score -1.6.  FRAX not increased. Supplementing with vitamin D and calcium. Good fitness.  BMI 22.81. Repeat BD in 05/2023.   Other orders - progesterone (PROMETRIUM) 100 MG capsule; Take 1 capsule (100 mg total) by mouth at bedtime. - estradiol (VIVELLE-DOT) 0.0375 MG/24HR; Place 1 patch onto the skin 2 (two) times a week.   Princess Bruins MD, 12:12 PM 04/16/2022

## 2022-04-24 LAB — CYTOLOGY - PAP
Comment: NEGATIVE
Diagnosis: UNDETERMINED — AB
High risk HPV: NEGATIVE

## 2022-04-25 ENCOUNTER — Other Ambulatory Visit: Payer: Self-pay | Admitting: *Deleted

## 2022-04-25 DIAGNOSIS — R87610 Atypical squamous cells of undetermined significance on cytologic smear of cervix (ASC-US): Secondary | ICD-10-CM

## 2022-05-17 ENCOUNTER — Encounter: Payer: Self-pay | Admitting: Obstetrics & Gynecology

## 2022-05-17 ENCOUNTER — Ambulatory Visit (INDEPENDENT_AMBULATORY_CARE_PROVIDER_SITE_OTHER): Admitting: Obstetrics & Gynecology

## 2022-05-17 ENCOUNTER — Other Ambulatory Visit (HOSPITAL_COMMUNITY)
Admission: RE | Admit: 2022-05-17 | Discharge: 2022-05-17 | Disposition: A | Discharge status: Home / Self Care | Source: Ambulatory Visit | Attending: Obstetrics & Gynecology | Admitting: Obstetrics & Gynecology

## 2022-05-17 DIAGNOSIS — R87610 Atypical squamous cells of undetermined significance on cytologic smear of cervix (ASC-US): Secondary | ICD-10-CM

## 2022-05-17 NOTE — Progress Notes (Signed)
    Joanne Prince [birth year] [id number]        66 y.o.  G2P2L2  RP: Many ASCUS Paps, last Pap ASCUS/HPV HR Neg 04/16/22 for Colposcopy  HPI: Many ASCUS Paps, last Pap ASCUS/HPV HR Neg 04/16/22.  Colpo No dysplasia in 2020.   OB History  Gravida Para Term Preterm AB Living  '2 2 2     2  '$ SAB IAB Ectopic Multiple Live Births               # Outcome Date GA Lbr Len/2nd Weight Sex Delivery Anes PTL Lv  2 Term           1 Term             Past medical history,surgical history, problem list, medications, allergies, family history and social history were all reviewed and documented in the EPIC chart.   Directed ROS with pertinent positives and negatives documented in the history of present illness/assessment and plan.  Exam:  Vitals:   05/17/22 1425  BP: 128/82  Pulse: 76  Resp: 16   General appearance:  Normal  Colposcopy Procedure Note Halaina L Bahar 05/17/2022  Indications: H/O ASCUS Paps with Pap ASCUS/HPV HR Neg 04/16/22  Procedure Details  The risks and benefits of the procedure and Written informed consent obtained.  Speculum placed in vagina and excellent visualization of cervix achieved, cervix swabbed x 3 with acetic acid solution.  Findings:  Cervix colposcopy: Physical Exam Genitourinary:       Vaginal colposcopy: Normal  Vulvar colposcopy: Normal  Perirectal colposcopy: Normal  The cervix was sprayed with Hurricane before performing the cervical biopsies.  Specimens: Cervical Bx at 11 and 1 O'Clock  Complications: None, good hemostasis with Silver Nitrate . Plan: Management per results   Assessment/Plan:  66 y.o. G2P2L2   1. ASCUS of cervix with negative high risk HPV Many ASCUS Paps, last Pap ASCUS/HPV HR Neg 04/16/22.  Colpo No dysplasia in 2020.  Colposcopy findings reviewed with patient.  Probable mild dysplasia, management per patho results.  Precautions reviewed. - Colposcopy - Surgical pathology( [place]/ [facility])   Princess Bruins MD, 2:48 PM 05/17/2022

## 2022-05-22 LAB — SURGICAL PATHOLOGY

## 2022-05-25 ENCOUNTER — Other Ambulatory Visit: Payer: Self-pay | Admitting: Obstetrics & Gynecology

## 2022-08-22 IMAGING — CT CT CARDIAC CORONARY ARTERY CALCIUM SCORE
3 series · 14 of 20 positions shown, 15 images · non-contrast
Comparison: None.
COMPARISON: None.

Addendum:
EXAM:
OVER-READ INTERPRETATION  CT CHEST

The following report is an over-read performed by radiologist Dr.
Tetsuharu Enamorado [REDACTED] on 02/21/2021. This
over-read does not include interpretation of cardiac or coronary
anatomy or pathology. The coronary calcium score interpretation by
the cardiologist is attached.
CLINICAL DATA: Cardiovascular Disease Risk stratification
Coronary Calcium Score
TECHNIQUE: A gated, non-contrast computed tomography scan of the heart was
performed using 3mm slice thickness. Axial images were analyzed on a
dedicated workstation. Calcium scoring of the coronary arteries was
performed using the Agatston method.

[Series 2: casc 3.0 bv41 2 bestdiast 75 % · axial · 0.41mm/px · z∈[+1314,+1386]mm · 4 of 42 slices shown, 5 images]
[im 9/42  vessel]
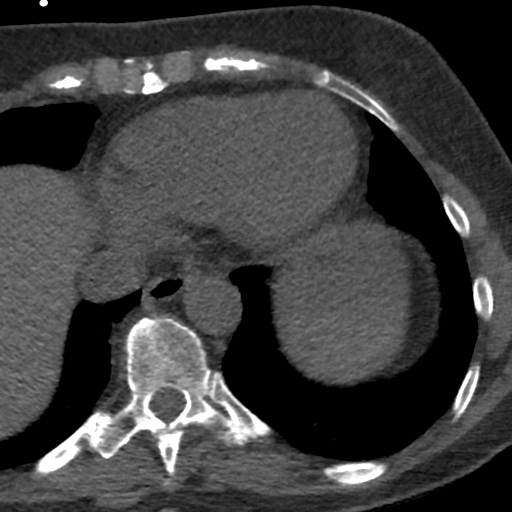
[im 9/42  lung]
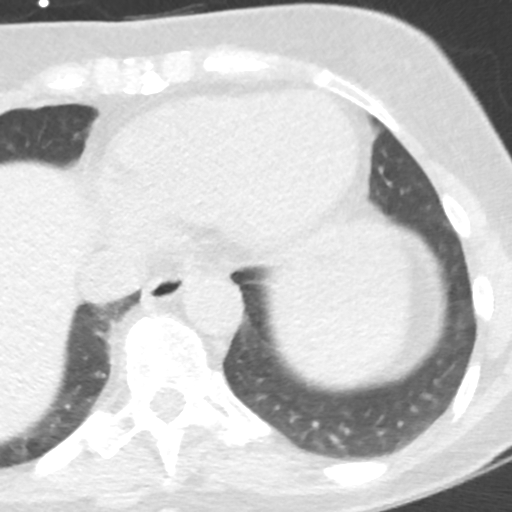
[im 17/42  vessel]
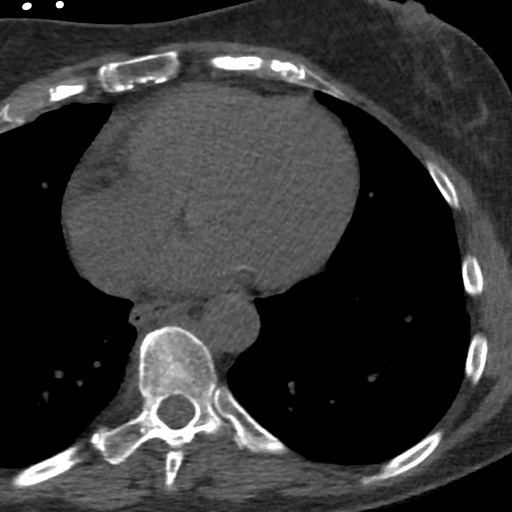
[im 25/42  vessel]
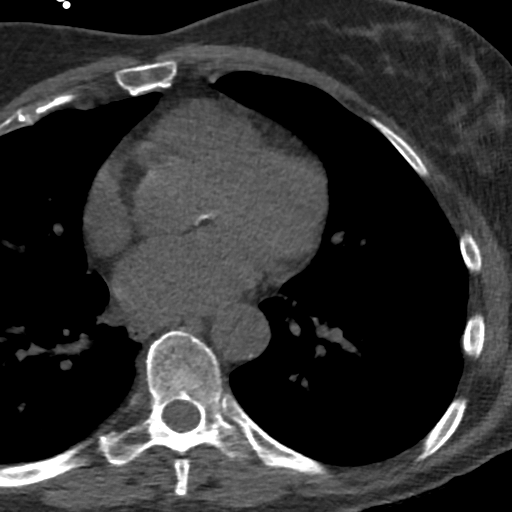
[im 33/42  vessel]
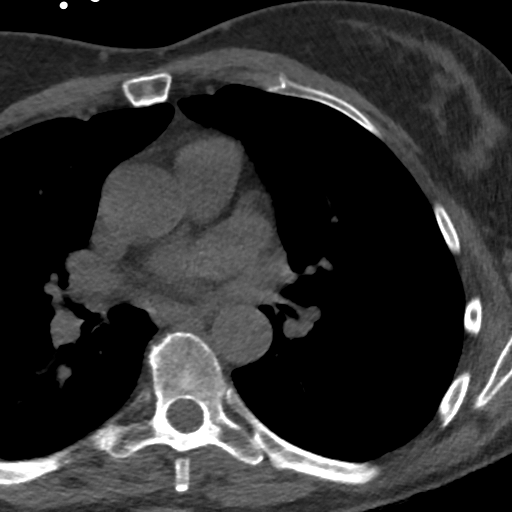

[Series 3: lung 74 % · axial · 0.58mm/px · z∈[+1308,+1392]mm · 5 of 42 slices shown]
[im 7/42  lung]
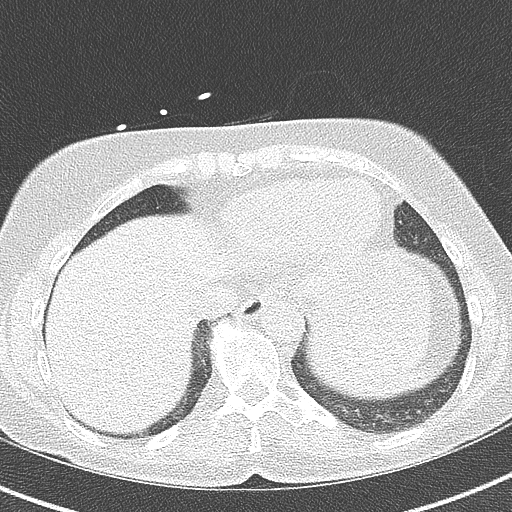
[im 14/42  lung]
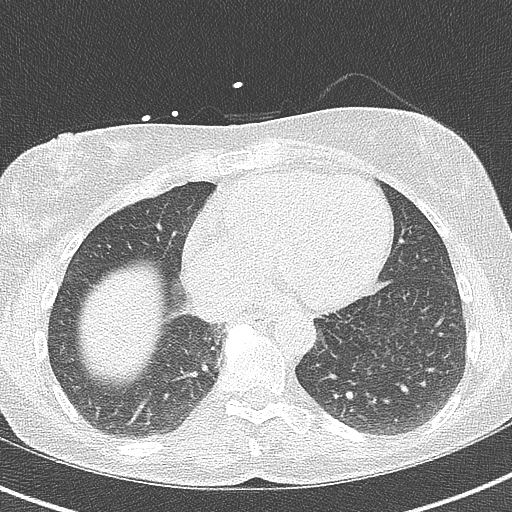
[im 21/42  lung]
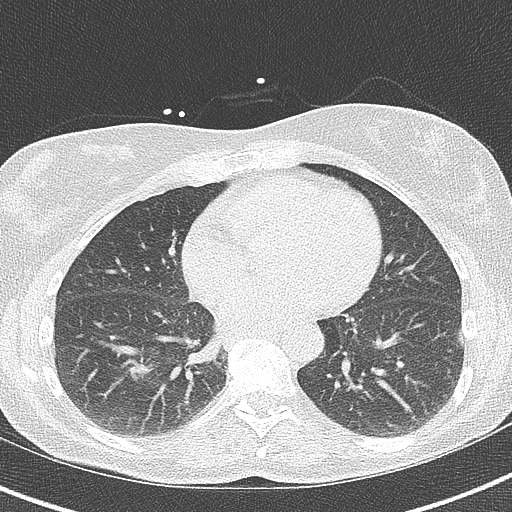
[im 28/42  lung]
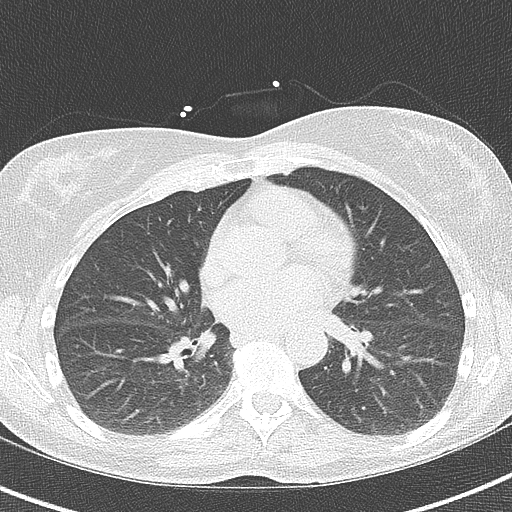
[im 35/42  lung]
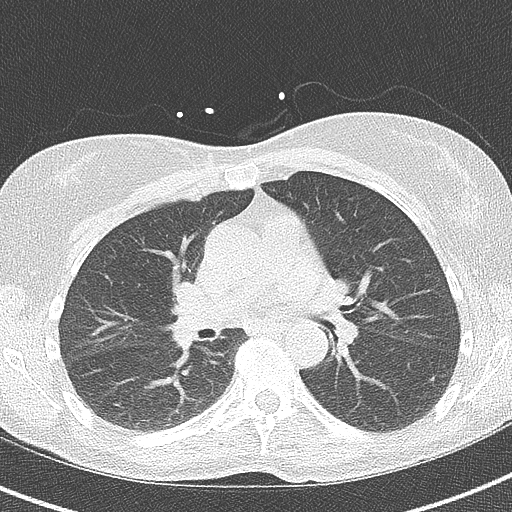

[Series 4: lung st 74 % · axial · 0.58mm/px · z∈[+1308,+1392]mm · 5 of 42 slices shown]
[im 7/42  lung]
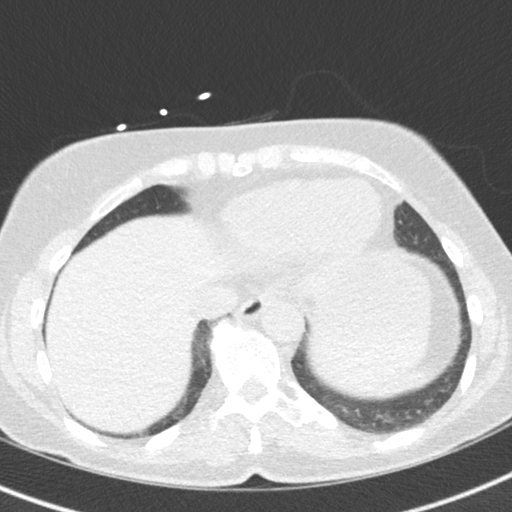
[im 14/42  lung]
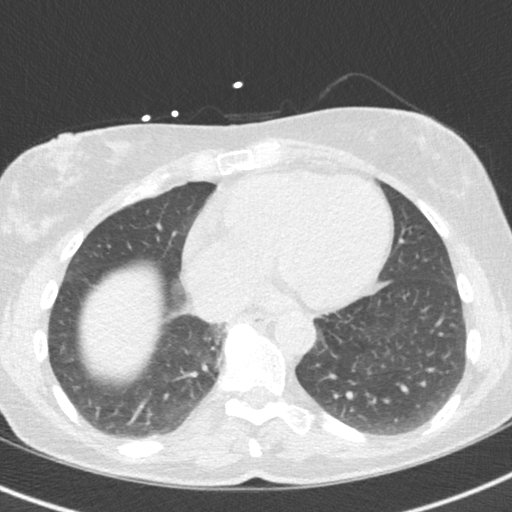
[im 21/42  lung]
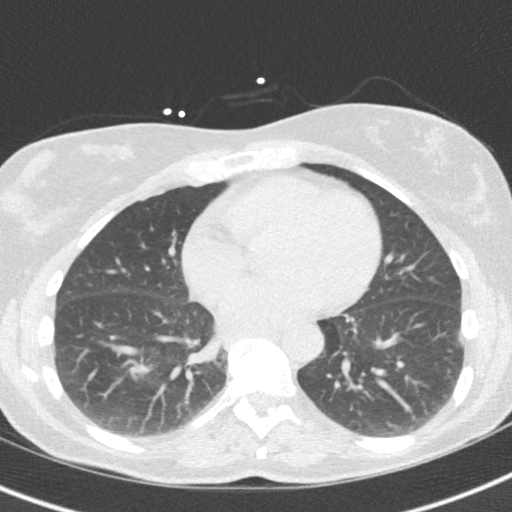
[im 28/42  lung]
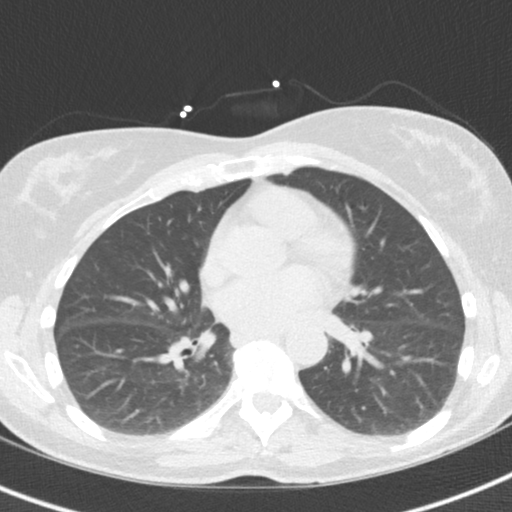
[im 35/42  lung]
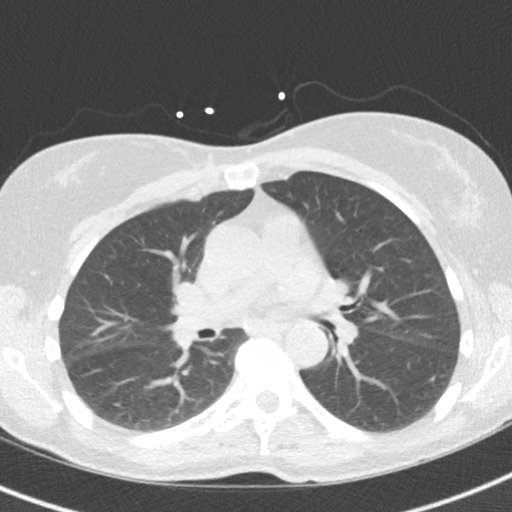

[14 of 20 positions shown; findings below may reference images not displayed]

FINDINGS: Aortic atherosclerosis. Within the visualized portions of the thorax
there are no suspicious appearing pulmonary nodules or masses, there
is no acute consolidative airspace disease, no pleural effusions, no
pneumothorax and no lymphadenopathy. Visualized portions of the
upper abdomen are unremarkable. There are no aggressive appearing
lytic or blastic lesions noted in the visualized portions of the
skeleton.
IMPRESSION: 1.  Aortic Atherosclerosis (HA2NX-ZKO.O).
FINDINGS: Coronary arteries: Normal origins.

Coronary Calcium Score:

Left main: 0

Left anterior descending artery: 0

Left circumflex artery: 0

Right coronary artery: 0

Total: 0

Percentile: 0

Pericardium: Normal.

Ascending Aorta: Normal caliber.  Aortic atherosclerosis.

Non-cardiac: See separate report from [REDACTED].
IMPRESSION: 1. Coronary calcium score of 0.  This is a low risk study.
2. Aortic atherosclerosis.



If CAC=0, it is reasonable to withhold statin therapy and reassess
in 5 to 10 years, as long as higher risk conditions are absent
(diabetes mellitus, family history of premature CHD in first degree
relatives (males <55 years; females <65 years), cigarette smoking,
or LDL >=190 mg/dL).

If CAC is 1 to 99, it is reasonable to initiate statin therapy for
patients >=55 years of age.

If CAC is >=100 or >=75th percentile, it is reasonable to initiate
statin therapy at any age.

Cardiology referral should be considered for patients with CAC
scores >=400 or >=75th percentile.

*0670 AHA/ACC/AACVPR/AAPA/ABC/ELKIN MANUEL/ADENYO/VIKA/Andrz/SIRIANNE/COLE/FALS
Guideline on the Management of Blood Cholesterol: A Report of the
American College of Cardiology/American Heart Association Task Force
on Clinical Practice Guidelines. J Am Coll Cardiol.
1671;73(24):8293-8328.

*** End of Addendum ***
EXAM:
OVER-READ INTERPRETATION  CT CHEST

The following report is an over-read performed by radiologist Dr.
Tetsuharu Enamorado [REDACTED] on 02/21/2021. This
over-read does not include interpretation of cardiac or coronary
anatomy or pathology. The coronary calcium score interpretation by
the cardiologist is attached.
FINDINGS: Aortic atherosclerosis. Within the visualized portions of the thorax
there are no suspicious appearing pulmonary nodules or masses, there
is no acute consolidative airspace disease, no pleural effusions, no
pneumothorax and no lymphadenopathy. Visualized portions of the
upper abdomen are unremarkable. There are no aggressive appearing
lytic or blastic lesions noted in the visualized portions of the
skeleton.
IMPRESSION: 1.  Aortic Atherosclerosis (HA2NX-ZKO.O).

## 2022-10-03 ENCOUNTER — Encounter (INDEPENDENT_AMBULATORY_CARE_PROVIDER_SITE_OTHER): Admitting: Ophthalmology

## 2022-11-20 ENCOUNTER — Ambulatory Visit (INDEPENDENT_AMBULATORY_CARE_PROVIDER_SITE_OTHER): Payer: Medicare Other | Admitting: Obstetrics & Gynecology

## 2022-11-20 ENCOUNTER — Encounter: Payer: Self-pay | Admitting: Obstetrics & Gynecology

## 2022-11-20 ENCOUNTER — Other Ambulatory Visit (HOSPITAL_COMMUNITY)
Admission: RE | Admit: 2022-11-20 | Discharge: 2022-11-20 | Disposition: A | Discharge status: Home / Self Care | Payer: Medicare Other | Source: Ambulatory Visit | Attending: Obstetrics & Gynecology | Admitting: Obstetrics & Gynecology

## 2022-11-20 VITALS — BP 124/60 | HR 72

## 2022-11-20 DIAGNOSIS — N870 Mild cervical dysplasia: Secondary | ICD-10-CM | POA: Diagnosis present

## 2022-11-20 DIAGNOSIS — R87610 Atypical squamous cells of undetermined significance on cytologic smear of cervix (ASC-US): Secondary | ICD-10-CM | POA: Diagnosis not present

## 2022-11-20 DIAGNOSIS — Z1151 Encounter for screening for human papillomavirus (HPV): Secondary | ICD-10-CM | POA: Insufficient documentation

## 2022-11-20 DIAGNOSIS — Z01411 Encounter for gynecological examination (general) (routine) with abnormal findings: Secondary | ICD-10-CM | POA: Diagnosis not present

## 2022-11-20 DIAGNOSIS — Z9289 Personal history of other medical treatment: Secondary | ICD-10-CM | POA: Diagnosis not present

## 2022-11-20 NOTE — Progress Notes (Signed)
    REGINIA SLIGHT 1956/02/02 [id number]        67 y.o.  [id number]   RP: Mild cervical dysplasia for 6 month Pap test  HPI: Pap ASCUS/HPV HR Neg in 04/2022, second ASCUS.  Colpo 05/2022 Mild cervical dysplasia.     OB History  Gravida Para Term Preterm AB Living  2 2 2     2   SAB IAB Ectopic Multiple Live Births               # Outcome Date GA Lbr Len/2nd Weight Sex Delivery Anes PTL Lv  2 Term           1 Term             Past medical history,surgical history, problem list, medications, allergies, family history and social history were all reviewed and documented in the EPIC chart.   Directed ROS with pertinent positives and negatives documented in the history of present illness/assessment and plan.  Exam:  Vitals:   11/20/22 1411  BP: 124/60  Pulse: 72  SpO2: 98%   General appearance:  Normal  Gynecologic exam: Vulva normal.  Speculum:  Cervix/Vagina normal.  Normal secretions, no blood.  Pap reflex done.   Assessment/Plan:  67 y.o. G2P20L2   1. Dysplasia of cervix, low grade (CIN 1) Pap ASCUS/HPV HR Neg in 04/2022, second ASCUS.  Colpo 05/2022 Mild cervical dysplasia.  6 month Pap today, management per results. - Cytology - PAP( [place])  2. Personal history of other medical treatment   Princess Bruins MD, 2:28 PM 11/20/2022

## 2022-11-25 LAB — CYTOLOGY - PAP
Comment: NEGATIVE
Diagnosis: UNDETERMINED — AB
High risk HPV: NEGATIVE

## 2023-01-17 ENCOUNTER — Other Ambulatory Visit (INDEPENDENT_AMBULATORY_CARE_PROVIDER_SITE_OTHER): Payer: Medicare Other

## 2023-01-17 DIAGNOSIS — R739 Hyperglycemia, unspecified: Secondary | ICD-10-CM

## 2023-01-17 DIAGNOSIS — E7800 Pure hypercholesterolemia, unspecified: Secondary | ICD-10-CM | POA: Diagnosis not present

## 2023-01-17 DIAGNOSIS — E538 Deficiency of other specified B group vitamins: Secondary | ICD-10-CM

## 2023-01-17 DIAGNOSIS — E559 Vitamin D deficiency, unspecified: Secondary | ICD-10-CM

## 2023-01-17 LAB — CBC WITH DIFFERENTIAL/PLATELET
Basophils Absolute: 0.1 10*3/uL (ref 0.0–0.1)
Basophils Relative: 1.4 % (ref 0.0–3.0)
Eosinophils Absolute: 0.1 10*3/uL (ref 0.0–0.7)
Eosinophils Relative: 1.7 % (ref 0.0–5.0)
HCT: 43.7 % (ref 36.0–46.0)
Hemoglobin: 14.9 g/dL (ref 12.0–15.0)
Lymphocytes Relative: 31.1 % (ref 12.0–46.0)
Lymphs Abs: 1.7 10*3/uL (ref 0.7–4.0)
MCHC: 34.2 g/dL (ref 30.0–36.0)
MCV: 91.1 fl (ref 78.0–100.0)
Monocytes Absolute: 0.3 10*3/uL (ref 0.1–1.0)
Monocytes Relative: 6 % (ref 3.0–12.0)
Neutro Abs: 3.3 10*3/uL (ref 1.4–7.7)
Neutrophils Relative %: 59.8 % (ref 43.0–77.0)
Platelets: 225 10*3/uL (ref 150.0–400.0)
RBC: 4.8 Mil/uL (ref 3.87–5.11)
RDW: 12.9 % (ref 11.5–15.5)
WBC: 5.5 10*3/uL (ref 4.0–10.5)

## 2023-01-17 LAB — URINALYSIS, ROUTINE W REFLEX MICROSCOPIC
Bilirubin Urine: NEGATIVE
Hgb urine dipstick: NEGATIVE
Ketones, ur: NEGATIVE
Leukocytes,Ua: NEGATIVE
Nitrite: NEGATIVE
Specific Gravity, Urine: <=1.005 — AB (ref 1.000–1.030)
Total Protein, Urine: NEGATIVE
Urine Glucose: NEGATIVE
Urobilinogen, UA: 0.2 (ref 0.0–1.0)
WBC, UA: NONE SEEN (ref 0–?)
pH: 6 (ref 5.0–8.0)

## 2023-01-17 LAB — HEPATIC FUNCTION PANEL
ALT: 15 U/L (ref 0–35)
AST: 21 U/L (ref 0–37)
Albumin: 4.3 g/dL (ref 3.5–5.2)
Alkaline Phosphatase: 47 U/L (ref 39–117)
Bilirubin, Direct: 0.2 mg/dL (ref 0.0–0.3)
Total Bilirubin: 1.7 mg/dL — ABNORMAL HIGH (ref 0.2–1.2)
Total Protein: 7.1 g/dL (ref 6.0–8.3)

## 2023-01-17 LAB — LIPID PANEL
Cholesterol: 187 mg/dL (ref 0–200)
HDL: 59.8 mg/dL (ref >39.00)
LDL Cholesterol: 107 mg/dL — ABNORMAL HIGH (ref 0–99)
NonHDL: 127.46
Total CHOL/HDL Ratio: 3
Triglycerides: 102 mg/dL (ref 0.0–149.0)
VLDL: 20.4 mg/dL (ref 0.0–40.0)

## 2023-01-17 LAB — BASIC METABOLIC PANEL
BUN: 15 mg/dL (ref 6–23)
CO2: 29 mEq/L (ref 19–32)
Calcium: 9.2 mg/dL (ref 8.4–10.5)
Chloride: 103 mEq/L (ref 96–112)
Creatinine, Ser: 0.82 mg/dL (ref 0.40–1.20)
GFR: 75.02 mL/min (ref >60.00)
Glucose, Bld: 99 mg/dL (ref 70–99)
Potassium: 4.6 mEq/L (ref 3.5–5.1)
Sodium: 138 mEq/L (ref 135–145)

## 2023-01-17 LAB — VITAMIN B12: Vitamin B-12: 594 pg/mL (ref 211–911)

## 2023-01-17 LAB — TSH: TSH: 1.07 u[IU]/mL (ref 0.35–5.50)

## 2023-01-17 LAB — HEMOGLOBIN A1C: Hgb A1c MFr Bld: 5.7 % (ref 4.6–6.5)

## 2023-01-17 LAB — VITAMIN D 25 HYDROXY (VIT D DEFICIENCY, FRACTURES): VITD: 63.97 ng/mL (ref 30.00–100.00)

## 2023-01-24 ENCOUNTER — Encounter: Payer: Self-pay | Admitting: Internal Medicine

## 2023-01-24 ENCOUNTER — Ambulatory Visit (INDEPENDENT_AMBULATORY_CARE_PROVIDER_SITE_OTHER): Payer: Medicare Other | Admitting: Internal Medicine

## 2023-01-24 ENCOUNTER — Telehealth: Payer: Self-pay | Admitting: Internal Medicine

## 2023-01-24 VITALS — BP 150/84 | HR 75 | Temp 98.4°F | Ht 64.75 in | Wt 136.0 lb

## 2023-01-24 DIAGNOSIS — R739 Hyperglycemia, unspecified: Secondary | ICD-10-CM

## 2023-01-24 DIAGNOSIS — E7800 Pure hypercholesterolemia, unspecified: Secondary | ICD-10-CM | POA: Diagnosis not present

## 2023-01-24 DIAGNOSIS — E538 Deficiency of other specified B group vitamins: Secondary | ICD-10-CM

## 2023-01-24 DIAGNOSIS — J309 Allergic rhinitis, unspecified: Secondary | ICD-10-CM | POA: Diagnosis not present

## 2023-01-24 DIAGNOSIS — E559 Vitamin D deficiency, unspecified: Secondary | ICD-10-CM

## 2023-01-24 DIAGNOSIS — R030 Elevated blood-pressure reading, without diagnosis of hypertension: Secondary | ICD-10-CM

## 2023-01-24 NOTE — Addendum Note (Signed)
Addended by: Corwin Levins on: 01/24/2023 04:29 PM   Modules accepted: Orders

## 2023-01-24 NOTE — Telephone Encounter (Signed)
Patient would like to have lab orders put in prior to her physical next year 01/27/2024. She would like a call back to schedule. Best callback is [phone].

## 2023-01-24 NOTE — Telephone Encounter (Signed)
Ok orders done 

## 2023-01-24 NOTE — Patient Instructions (Signed)

## 2023-01-24 NOTE — Progress Notes (Unsigned)
Patient ID: Joanne Prince, female   DOB: [birth year], 67 y.o.   MRN: [id number]         Chief Complaint:: yearly exam; declines prevnar       HPI:  Joanne Prince is a 67 y.o. female here overall doing ok.  Pt denies chest pain, increased sob or doe, wheezing, orthopnea, PND, increased LE swelling, palpitations, dizziness or syncope.   Pt denies polydipsia, polyuria, or new focal neuro s/s.    Pt denies fever, wt loss, night sweats, loss of appetite, or other constitutional symptoms  BP has been well controlled at home per pt.  Does have several wks ongoing nasal allergy symptoms with clearish congestion, itch and sneezing, without fever, pain, ST, cough, swelling or wheezing.  Wt Readings from Last 3 Encounters:  01/24/23 136 lb (61.7 kg)  04/16/22 136 lb (61.7 kg)  01/21/22 134 lb (60.8 kg)   BP Readings from Last 3 Encounters:  01/24/23 (!) 150/84  11/20/22 124/60  05/17/22 128/82   Immunization History  Administered Date(s) Administered   Influenza Inj Mdck Quad Pf 05/26/2019   Influenza Split 06/09/2012, 04/14/2016   Influenza Whole 06/21/2008, 07/25/2009   Influenza,inj,Quad PF,6+ Mos 07/10/2013, 06/16/2014, 04/29/2017, 04/27/2018   Influenza-Unspecified 06/27/2015, 04/29/2017   PFIZER(Purple Top)SARS-COV-2 Vaccination 10/29/2019, 11/19/2019, 07/28/2020   Td 09/02/2001, 07/03/2010   Tdap 03/08/2015   Zoster Recombinat (Shingrix) 04/29/2017, 12/18/2017, 12/19/2017   Health Maintenance Due  Topic Date Due   Medicare Annual Wellness (AWV)  Never done   Pneumonia Vaccine 83+ Years old (1 of 1 - PCV) Never done      Past Medical History:  Diagnosis Date   ALLERGIC RHINITIS 07/01/2007   Qualifier: Diagnosis of  By: Jonny Ruiz MD, Len Blalock    ASCUS of cervix with negative high risk HPV 03/2017, 04/2019   DISC DISEASE, LUMBAR 07/01/2007   Qualifier: Diagnosis of  By: Jonny Ruiz MD, Len Blalock    GENITAL HERPES, HX OF 07/01/2007   Qualifier: Diagnosis of  By: Jonny Ruiz MD, Len Blalock    Heart murmur     History of indigestion    History of kidney stones    HYPERLIPIDEMIA 07/26/2010   Qualifier: Diagnosis of  By: Jonny Ruiz MD, Len Blalock    Kidney stone 01/13/2020   Kidney stones    MITRAL VALVE PROLAPSE, HX OF 07/01/2007   Qualifier: Diagnosis of  By: Jonny Ruiz MD, Len Blalock    NEPHROLITHIASIS, HX OF 10/28/2007   Qualifier: Diagnosis of  By: Debby Bud MD, Liborio Nixon, LOCAL NOS, SHOULDER 07/01/2007   Qualifier: Diagnosis of  By: Jonny Ruiz MD, Len Blalock    OSTEOPENIA 05/2019   T score -2.0 FRAX 9.8% / 1.3% stable from prior DEXA   Past Surgical History:  Procedure Laterality Date   cholecystectomy     CYSTOSCOPY WITH RETROGRADE PYELOGRAM, URETEROSCOPY AND STENT PLACEMENT Left 11/06/2018   Procedure: CYSTOSCOPY WITH RETROGRADE PYELOGRAM, URETEROSCOPY AND STENT PLACEMENT;  Surgeon: Sebastian Ache, MD;  Location: [facility];  Service: Urology;  Laterality: Left;   HOLMIUM LASER APPLICATION Left 11/06/2018   Procedure: HOLMIUM LASER APPLICATION;  Surgeon: Sebastian Ache, MD;  Location: [facility];  Service: Urology;  Laterality: Left;   LASIX Bilateral    uterine polypectomy     WISDOM TOOTH EXTRACTION      reports that she has never smoked. She has never used smokeless tobacco. She reports current alcohol use. She reports that she does not use drugs. family history  includes Celiac disease in her maternal uncle; Hypertension in her mother. No Known Allergies Current Outpatient Medications on File Prior to Visit  Medication Sig Dispense Refill   Ascorbic Acid (VITAMIN C PO) Take by mouth.     Cholecalciferol (VITAMIN D3 PO) Take by mouth.     estradiol (VIVELLE-DOT) 0.0375 MG/24HR Place 1 patch onto the skin 2 (two) times a week. 24 patch 4   fexofenadine (ALLEGRA) 180 MG tablet Take 180 mg by mouth as needed.     Multiple Vitamin (MULTIVITAMIN) tablet Take 1 tablet by mouth daily.     niacinamide 500 MG tablet Take 500 mg by mouth 2 (two) times daily with a meal.      progesterone (PROMETRIUM) 100 MG capsule Take 1 capsule (100 mg total) by mouth at bedtime. 90 capsule 4   No current facility-administered medications on file prior to visit.        ROS:  All others reviewed and negative.  Objective        PE:  BP (!) 150/84 (BP Location: Right Arm, Patient Position: Sitting, Cuff Size: Normal)   Pulse 75   Temp 98.4 F (36.9 C) (Oral)   Ht 5' 4.75" (1.645 m)   Wt 136 lb (61.7 kg)   LMP 03/08/2005 Comment: not sexually active  SpO2 98%   BMI 22.81 kg/m                 Constitutional: Pt appears in NAD               HENT: Head: NCAT.                Right Ear: External ear normal.                 Left Ear: External ear normal.                Eyes: . Pupils are equal, round, and reactive to light. Conjunctivae and EOM are normal               Nose: without d/c or deformity               Neck: Neck supple. Gross normal ROM               Cardiovascular: Normal rate and regular rhythm.                 Pulmonary/Chest: Effort normal and breath sounds without rales or wheezing.                Abd:  Soft, NT, ND, + BS, no organomegaly               Neurological: Pt is alert. At baseline orientation, motor grossly intact               Skin: Skin is warm. No rashes, no other new lesions, LE edema - none               Psychiatric: Pt behavior is normal without agitation   Micro: none  Cardiac tracings I have personally interpreted today:  none  Pertinent Radiological findings (summarize): none   Lab Results  Component Value Date   WBC 5.5 01/17/2023   HGB 14.9 01/17/2023   HCT 43.7 01/17/2023   PLT 225.0 01/17/2023   GLUCOSE 99 01/17/2023   CHOL 187 01/17/2023   TRIG 102.0 01/17/2023   HDL 59.80 01/17/2023   LDLDIRECT 113.0 01/13/2020   LDLCALC 107 (  H) 01/17/2023   ALT 15 01/17/2023   AST 21 01/17/2023   NA 138 01/17/2023   K 4.6 01/17/2023   CL 103 01/17/2023   CREATININE 0.82 01/17/2023   BUN 15 01/17/2023   CO2 29 01/17/2023    TSH 1.07 01/17/2023   HGBA1C 5.7 01/17/2023   Assessment/Plan:  Bridie L Manly is a 67 y.o. 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  HLD (hyperlipidemia) Lab Results  Component Value Date   LDLCALC 107 (H) 01/17/2023   Uncontrolled, goal ldl < 70 with hx of aortic atherosclerosis, but Card CT score was zero in 2022, so pt for lower cholesterol diet,  declines statin for now   Blood pressure elevated without history of HTN BP Readings from Last 3 Encounters:  01/24/23 (!) 150/84  11/20/22 124/60  05/17/22 128/82   Uncontrolled today, but pt states controlled at home, pt to continue medical treatment  - diet, wt control   Hyperglycemia Lab Results  Component Value Date   HGBA1C 5.7 01/17/2023   Stable, pt to continue current medical treatment   - diet, wt control   Allergic rhinitis Mild to mod, for restart allegra 180 qd prn,,  to f/u any worsening symptoms or concerns Followup: Return in about 1 year (around 01/24/2024).  Oliver Barre, MD 01/26/2023 1:59 PM [facility]

## 2023-01-26 ENCOUNTER — Encounter: Payer: Self-pay | Admitting: Internal Medicine

## 2023-01-26 DIAGNOSIS — J309 Allergic rhinitis, unspecified: Secondary | ICD-10-CM | POA: Insufficient documentation

## 2023-01-26 NOTE — Assessment & Plan Note (Signed)
Mild to mod, for restart allegra 180 qd prn,,  to f/u any worsening symptoms or concerns

## 2023-01-26 NOTE — Assessment & Plan Note (Signed)
BP Readings from Last 3 Encounters:  01/24/23 (!) 150/84  11/20/22 124/60  05/17/22 128/82   Uncontrolled today, but pt states controlled at home, pt to continue medical treatment  - diet, wt control

## 2023-01-26 NOTE — Assessment & Plan Note (Signed)
Lab Results  Component Value Date   HGBA1C 5.7 01/17/2023   Stable, pt to continue current medical treatment  - diet, wt control  

## 2023-01-26 NOTE — Assessment & Plan Note (Signed)
Lab Results  Component Value Date   LDLCALC 107 (H) 01/17/2023   Uncontrolled, goal ldl < 70 with hx of aortic atherosclerosis, but Card CT score was zero in 2022, so pt for lower cholesterol diet,  declines statin for now

## 2023-04-18 ENCOUNTER — Ambulatory Visit: Admitting: Obstetrics & Gynecology

## 2023-05-01 LAB — HM DIABETES EYE EXAM

## 2023-05-13 ENCOUNTER — Other Ambulatory Visit: Payer: Self-pay | Admitting: Obstetrics & Gynecology

## 2023-05-13 DIAGNOSIS — Z79890 Hormone replacement therapy (postmenopausal): Secondary | ICD-10-CM

## 2023-05-14 NOTE — Telephone Encounter (Signed)
Med refill request: Progesterone Last AEX: 04/16/2022-ML Next AEX: 05/23/2023-JC Last MMG (if hormonal med): 05/20/2022-neg birads 1 Refill authorized: rx pend.

## 2023-05-22 ENCOUNTER — Ambulatory Visit (INDEPENDENT_AMBULATORY_CARE_PROVIDER_SITE_OTHER): Payer: Medicare Other

## 2023-05-22 ENCOUNTER — Ambulatory Visit (INDEPENDENT_AMBULATORY_CARE_PROVIDER_SITE_OTHER): Payer: Medicare Other | Admitting: Internal Medicine

## 2023-05-22 ENCOUNTER — Encounter: Payer: Self-pay | Admitting: Internal Medicine

## 2023-05-22 VITALS — BP 142/88 | HR 80 | Temp 98.4°F | Ht 64.75 in | Wt 133.0 lb

## 2023-05-22 DIAGNOSIS — E7800 Pure hypercholesterolemia, unspecified: Secondary | ICD-10-CM | POA: Diagnosis not present

## 2023-05-22 DIAGNOSIS — J309 Allergic rhinitis, unspecified: Secondary | ICD-10-CM

## 2023-05-22 DIAGNOSIS — R079 Chest pain, unspecified: Secondary | ICD-10-CM | POA: Diagnosis not present

## 2023-05-22 DIAGNOSIS — Z23 Encounter for immunization: Secondary | ICD-10-CM

## 2023-05-22 DIAGNOSIS — R739 Hyperglycemia, unspecified: Secondary | ICD-10-CM

## 2023-05-22 LAB — CBC WITH DIFFERENTIAL/PLATELET
Basophils Absolute: 0.1 10*3/uL (ref 0.0–0.1)
Basophils Relative: 1 % (ref 0.0–3.0)
Eosinophils Absolute: 0 10*3/uL (ref 0.0–0.7)
Eosinophils Relative: 0.6 % (ref 0.0–5.0)
HCT: 46.5 % — ABNORMAL HIGH (ref 36.0–46.0)
Hemoglobin: 15.1 g/dL — ABNORMAL HIGH (ref 12.0–15.0)
Lymphocytes Relative: 25.3 % (ref 12.0–46.0)
Lymphs Abs: 1.7 10*3/uL (ref 0.7–4.0)
MCHC: 32.5 g/dL (ref 30.0–36.0)
MCV: 92 fl (ref 78.0–100.0)
Monocytes Absolute: 0.3 10*3/uL (ref 0.1–1.0)
Monocytes Relative: 5.1 % (ref 3.0–12.0)
Neutro Abs: 4.7 10*3/uL (ref 1.4–7.7)
Neutrophils Relative %: 68 % (ref 43.0–77.0)
Platelets: 256 10*3/uL (ref 150.0–400.0)
RBC: 5.05 Mil/uL (ref 3.87–5.11)
RDW: 13 % (ref 11.5–15.5)
WBC: 6.9 10*3/uL (ref 4.0–10.5)

## 2023-05-22 LAB — BASIC METABOLIC PANEL
BUN: 14 mg/dL (ref 6–23)
CO2: 29 mEq/L (ref 19–32)
Calcium: 9.5 mg/dL (ref 8.4–10.5)
Chloride: 103 mEq/L (ref 96–112)
Creatinine, Ser: 0.83 mg/dL (ref 0.40–1.20)
GFR: 73.76 mL/min (ref >60.00)
Glucose, Bld: 106 mg/dL — ABNORMAL HIGH (ref 70–99)
Potassium: 4.1 mEq/L (ref 3.5–5.1)
Sodium: 140 mEq/L (ref 135–145)

## 2023-05-22 LAB — HEPATIC FUNCTION PANEL
ALT: 13 U/L (ref 0–35)
AST: 18 U/L (ref 0–37)
Albumin: 4.5 g/dL (ref 3.5–5.2)
Alkaline Phosphatase: 57 U/L (ref 39–117)
Bilirubin, Direct: 0.2 mg/dL (ref 0.0–0.3)
Total Bilirubin: 1.3 mg/dL — ABNORMAL HIGH (ref 0.2–1.2)
Total Protein: 7.7 g/dL (ref 6.0–8.3)

## 2023-05-22 NOTE — Patient Instructions (Addendum)
Your EKG was done today  Please continue all other medications as before, and refills have been done if requested.  Please have the pharmacy call with any other refills you may need.  Please continue your efforts at being more active, low cholesterol diet, and weight control.  Please keep your appointments with your specialists as you may have planned  Please go to the XRAY Department in the first floor for the x-ray testing  Please go to the LAB at the blood drawing area for the tests to be done  You will be contacted by phone if any changes need to be made immediately.  Otherwise, you will receive a letter about your results with an explanation, but please check with MyChart first.

## 2023-05-22 NOTE — Progress Notes (Signed)
The test results show that your current treatment is OK, as the tests are stable.  Please continue the same plan.  There is no other need for change of treatment or further evaluation based on these results, at this time.  thanks

## 2023-05-22 NOTE — Progress Notes (Signed)
Patient ID: Joanne Prince, female   DOB: 07-Dec-1955, 67 y.o.   MRN: [id number]        Chief Complaint: follow up HTN, CP, hld       HPI:  Joanne Prince is a 67 y.o. female here with c/o left parasternal soreness dull and sharp without radiation, mild intermittent for 4 days, without increased sob or doe, wheezing, orthopnea, PND, increased LE swelling, palpitations, dizziness or syncope, or diaphoresis, n/v.  Has had increased social stressors,  also with intermittent mild belching over the past wk, not clear if related.  Still trying to follow lower chol diet, does not want statin for now.  Does have several wks ongoing nasal allergy symptoms with clearish congestion, itch and sneezing, without fever, pain, ST, cough, swelling or wheezing.        Wt Readings from Last 3 Encounters:  05/23/23 132 lb (59.9 kg)  05/22/23 133 lb (60.3 kg)  01/24/23 136 lb (61.7 kg)   BP Readings from Last 3 Encounters:  05/23/23 138/78  05/22/23 (!) 142/88  01/24/23 (!) 150/84         Past Medical History:  Diagnosis Date   ALLERGIC RHINITIS 07/01/2007   Qualifier: Diagnosis of  By: Jonny Ruiz MD, Len Blalock    ASCUS of cervix with negative high risk HPV 03/2017, 04/2019   DISC DISEASE, LUMBAR 07/01/2007   Qualifier: Diagnosis of  By: Jonny Ruiz MD, Len Blalock    GENITAL HERPES, HX OF 07/01/2007   Qualifier: Diagnosis of  By: Jonny Ruiz MD, Len Blalock    Heart murmur    History of indigestion    History of kidney stones    HYPERLIPIDEMIA 07/26/2010   Qualifier: Diagnosis of  By: Jonny Ruiz MD, Len Blalock    Kidney stone 01/13/2020   Kidney stones    MITRAL VALVE PROLAPSE, HX OF 07/01/2007   Qualifier: Diagnosis of  By: Jonny Ruiz MD, Len Blalock    NEPHROLITHIASIS, HX OF 10/28/2007   Qualifier: Diagnosis of  By: Debby Bud MD, Liborio Nixon, LOCAL NOS, SHOULDER 07/01/2007   Qualifier: Diagnosis of  By: Jonny Ruiz MD, Len Blalock    OSTEOPENIA 05/2019   T score -2.0 FRAX 9.8% / 1.3% stable from prior DEXA   Past Surgical History:  Procedure  Laterality Date   cholecystectomy     CYSTOSCOPY WITH RETROGRADE PYELOGRAM, URETEROSCOPY AND STENT PLACEMENT Left 11/06/2018   Procedure: CYSTOSCOPY WITH RETROGRADE PYELOGRAM, URETEROSCOPY AND STENT PLACEMENT;  Surgeon: Sebastian Ache, MD;  Location: [facility];  Service: Urology;  Laterality: Left;   HOLMIUM LASER APPLICATION Left 11/06/2018   Procedure: HOLMIUM LASER APPLICATION;  Surgeon: Sebastian Ache, MD;  Location: [facility]  Service: Urology;  Laterality: Left;   LASIX Bilateral    uterine polypectomy     WISDOM TOOTH EXTRACTION      reports that she has never smoked. She has never been exposed to tobacco smoke. She has never used smokeless tobacco. She reports current alcohol use. She reports that she does not use drugs. family history includes Celiac disease in her maternal uncle; Hypertension in her mother. No Known Allergies Current Outpatient Medications on File Prior to Visit  Medication Sig Dispense Refill   Ascorbic Acid (VITAMIN C PO) Take by mouth.     Cholecalciferol (VITAMIN D3 PO) Take by mouth.     fexofenadine (ALLEGRA) 180 MG tablet Take 180 mg by mouth as needed. (Patient not taking: Reported on 05/23/2023)  Multiple Vitamin (MULTIVITAMIN) tablet Take 1 tablet by mouth daily.     niacinamide 500 MG tablet Take 500 mg by mouth 2 (two) times daily with a meal.     No current facility-administered medications on file prior to visit.        ROS:  All others reviewed and negative.  Objective        PE:  BP (!) 142/88 (BP Location: Right Arm, Patient Position: Sitting, Cuff Size: Normal)   Pulse 80   Temp 98.4 F (36.9 C) (Oral)   Ht 5' 4.75" (1.645 m)   Wt 133 lb (60.3 kg)   LMP 03/08/2005 Comment: not sexually active  SpO2 98%   BMI 22.30 kg/m                 Constitutional: Pt appears in NAD               HENT: Head: NCAT.                Right Ear: External ear normal.                 Left Ear: External ear normal.                 Eyes: . Pupils are equal, round, and reactive to light. Conjunctivae and EOM are normal               Nose: without d/c or deformity               Neck: Neck supple. Gross normal ROM               Cardiovascular: Normal rate and regular rhythm. ; tender area lef tmid parasternal noted                Pulmonary/Chest: Effort normal and breath sounds without rales or wheezing.                Abd:  Soft, NT, ND, + BS, no organomegaly               Neurological: Pt is alert. At baseline orientation, motor grossly intact               Skin: Skin is warm. No rashes, no other new lesions, LE edema - none               Psychiatric: Pt behavior is normal without agitation   Micro: none  Cardiac tracings I have personally interpreted today:  ECG - NSR 70  Pertinent Radiological findings (summarize): none   Lab Results  Component Value Date   WBC 6.9 05/22/2023   HGB 15.1 (H) 05/22/2023   HCT 46.5 (H) 05/22/2023   PLT 256.0 05/22/2023   GLUCOSE 106 (H) 05/22/2023   CHOL 187 01/17/2023   TRIG 102.0 01/17/2023   HDL 59.80 01/17/2023   LDLDIRECT 113.0 01/13/2020   LDLCALC 107 (H) 01/17/2023   ALT 13 05/22/2023   AST 18 05/22/2023   NA 140 05/22/2023   K 4.1 05/22/2023   CL 103 05/22/2023   CREATININE 0.83 05/22/2023   BUN 14 05/22/2023   CO2 29 05/22/2023   TSH 1.07 01/17/2023   HGBA1C 5.7 01/17/2023   Assessment/Plan:  Joanne Prince is a 67 y.o. White or Caucasian [1] female with  has a past medical history of ALLERGIC RHINITIS (07/01/2007), ASCUS of cervix with negative high risk HPV (03/2017, 04/2019), DISC DISEASE, LUMBAR (07/01/2007), GENITAL HERPES, HX  OF (07/01/2007), Heart murmur, History of indigestion, History of kidney stones, HYPERLIPIDEMIA (07/26/2010), Kidney stone (01/13/2020), Kidney stones, MITRAL VALVE PROLAPSE, HX OF (07/01/2007), NEPHROLITHIASIS, HX OF (10/28/2007), OSTEOARTHROSIS, LOCAL NOS, SHOULDER (07/01/2007), and OSTEOPENIA (05/2019).  Chest pain Ecg  reviewed, suspect msk vs GI, for cxr, labs as ordered, and declines stress test or cardiology for now  HLD (hyperlipidemia) Lab Results  Component Value Date   LDLCALC 107 (H) 01/17/2023   Uncontroled, pt to continue lower chol diet, declines stt   Hyperglycemia Lab Results  Component Value Date   HGBA1C 5.7 01/17/2023   Stable, pt to continue current medical treatment  -  diet, wt control   Allergic rhinitis Mild to mod, for otc allegra or nasacort asd,  to f/u any worsening symptoms or concerns  Followup: Return if symptoms worsen or fail to improve.  Oliver Barre, MD 05/24/2023 6:36 PM [facility]

## 2023-05-23 ENCOUNTER — Encounter: Payer: Self-pay | Admitting: Radiology

## 2023-05-23 ENCOUNTER — Other Ambulatory Visit (HOSPITAL_COMMUNITY)
Admission: RE | Admit: 2023-05-23 | Discharge: 2023-05-23 | Disposition: A | Discharge status: Home / Self Care | Payer: Medicare Other | Source: Ambulatory Visit | Attending: Radiology | Admitting: Radiology

## 2023-05-23 ENCOUNTER — Ambulatory Visit (INDEPENDENT_AMBULATORY_CARE_PROVIDER_SITE_OTHER): Payer: Medicare Other | Admitting: Radiology

## 2023-05-23 ENCOUNTER — Other Ambulatory Visit: Payer: Self-pay | Admitting: Obstetrics & Gynecology

## 2023-05-23 VITALS — BP 138/78 | Ht 64.0 in | Wt 132.0 lb

## 2023-05-23 DIAGNOSIS — B009 Herpesviral infection, unspecified: Secondary | ICD-10-CM

## 2023-05-23 DIAGNOSIS — M8580 Other specified disorders of bone density and structure, unspecified site: Secondary | ICD-10-CM | POA: Diagnosis not present

## 2023-05-23 DIAGNOSIS — Z1151 Encounter for screening for human papillomavirus (HPV): Secondary | ICD-10-CM | POA: Insufficient documentation

## 2023-05-23 DIAGNOSIS — N870 Mild cervical dysplasia: Secondary | ICD-10-CM

## 2023-05-23 DIAGNOSIS — N951 Menopausal and female climacteric states: Secondary | ICD-10-CM | POA: Diagnosis not present

## 2023-05-23 DIAGNOSIS — Z01419 Encounter for gynecological examination (general) (routine) without abnormal findings: Secondary | ICD-10-CM | POA: Insufficient documentation

## 2023-05-23 DIAGNOSIS — Z79890 Hormone replacement therapy (postmenopausal): Secondary | ICD-10-CM

## 2023-05-23 DIAGNOSIS — Z01411 Encounter for gynecological examination (general) (routine) with abnormal findings: Secondary | ICD-10-CM | POA: Insufficient documentation

## 2023-05-23 DIAGNOSIS — Z9189 Other specified personal risk factors, not elsewhere classified: Secondary | ICD-10-CM | POA: Diagnosis not present

## 2023-05-23 DIAGNOSIS — Z124 Encounter for screening for malignant neoplasm of cervix: Secondary | ICD-10-CM

## 2023-05-23 MED ORDER — ESTRADIOL 0.0375 MG/24HR TD PTTW
1.0000 | MEDICATED_PATCH | TRANSDERMAL | 4 refills | Status: DC
Start: 2023-05-26 — End: 2024-05-25

## 2023-05-23 MED ORDER — PROGESTERONE MICRONIZED 100 MG PO CAPS
100.0000 mg | ORAL_CAPSULE | Freq: Every evening | ORAL | 4 refills | Status: DC
Start: 2023-05-23 — End: 2024-05-25

## 2023-05-23 NOTE — Telephone Encounter (Signed)
Med refill request: vivelle dot 0.037mg  Last office visit: 11/20/22 Next AEX: patient has appointment today at 4:00, RF denied until apt. today Last MMG (if hormonal med) 05/20/22 Refill denied until apt. Today.

## 2023-05-23 NOTE — Progress Notes (Signed)
Joanne Prince 12/31/1955 [id number]   History: Postmenopausal 67 y.o. presents for annual exam. 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 Doing well on HRT, needs refills. Due for DEXA this year, osteopenic.    Gynecologic History Postmenopausal Last Pap: 11/20/22. Results were: abnormal ASCUS Last mammogram: 05/20/22. Results were: normal Last colonoscopy: 2016 DEXA:2022 osteopenia   Obstetric History OB History  Gravida Para Term Preterm AB Living  2 2 2     2   SAB IAB Ectopic Multiple Live Births               # Outcome Date GA Lbr Len/2nd Weight Sex Type Anes PTL Lv  2 Term           1 Term              The following portions of the patient's history were reviewed and updated as appropriate: allergies, current medications, past family history, past medical history, past social history, past surgical history, and problem list.  Review of Systems Pertinent items noted in HPI and remainder of comprehensive ROS otherwise negative.  Past medical history, past surgical history, family history and social history were all reviewed and documented in the EPIC chart.  Exam:  Vitals:   05/23/23 1552  BP: 138/78  Weight: 132 lb (59.9 kg)  Height: 5\' 4"  (1.626 m)   Body mass index is 22.66 kg/m.  General appearance:  Normal Thyroid:  Symmetrical, normal in size, without palpable masses or nodularity. Respiratory  Auscultation:  Clear without wheezing or rhonchi Cardiovascular  Auscultation:  Regular rate, without rubs, murmurs or gallops  Edema/varicosities:  Not grossly evident Abdominal  Soft,nontender, without masses, guarding or rebound.  Liver/spleen:  No organomegaly noted  Hernia:  None appreciated  Skin  Inspection:  Grossly normal Breasts: Examined lying and sitting.   Right: Without masses, retractions, nipple discharge or axillary adenopathy.   Left: Without masses, retractions, nipple discharge or axillary adenopathy. Genitourinary   Inguinal/mons:   Normal without inguinal adenopathy  External genitalia:  Normal appearing vulva with no masses, tenderness, or lesions  BUS/Urethra/Skene's glands:  Normal  Vagina:  Normal appearing with normal color and discharge, no lesions. Atrophy: mild   Cervix:  Normal appearing without discharge or lesions  Uterus:  Normal in size, shape and contour.  Midline and mobile, nontender  Adnexa/parametria:     Rt: Normal in size, without masses or tenderness.   Lt: Normal in size, without masses or tenderness.  Anus and perineum: Normal    Raynelle Fanning, CMA present for exam  Assessment/Plan:   1. Well female exam with routine gynecological exam - Cytology - PAP( [place])  2. Dysplasia of cervix, low grade (CIN 1) - Cytology - PAP( [place])  3. Postmenopausal hormone replacement therapy - progesterone (PROMETRIUM) 100 MG capsule; Take 1 capsule (100 mg total) by mouth at bedtime.  Dispense: 90 capsule; Refill: 4 - estradiol (VIVELLE-DOT) 0.0375 MG/24HR; Place 1 patch onto the skin 2 (two) times a week.  Dispense: 24 patch; Refill: 4    Discussed SBE, colonoscopy and DEXA screening as directed. Recommend of exercise weekly, including weight bearing exercise. Encouraged the use of seatbelts and sunscreen.  Return in 1 year for annual or sooner prn.  Arlie Solomons B WHNP-BC, 4:05 PM 05/23/2023

## 2023-05-24 DIAGNOSIS — R079 Chest pain, unspecified: Secondary | ICD-10-CM | POA: Insufficient documentation

## 2023-05-24 NOTE — Assessment & Plan Note (Signed)
Mild to mod, for otc allegra or nasacort asd,  to f/u any worsening symptoms or concerns

## 2023-05-24 NOTE — Assessment & Plan Note (Signed)
Ecg reviewed, suspect msk vs GI, for cxr, labs as ordered, and declines stress test or cardiology for now

## 2023-05-24 NOTE — Assessment & Plan Note (Signed)
Lab Results  Component Value Date   HGBA1C 5.7 01/17/2023   Stable, pt to continue current medical treatment  - diet, wt control

## 2023-05-24 NOTE — Assessment & Plan Note (Signed)
Lab Results  Component Value Date   LDLCALC 107 (H) 01/17/2023   Uncontroled, pt to continue lower chol diet, declines stt

## 2023-05-26 LAB — HM MAMMOGRAPHY

## 2023-05-27 ENCOUNTER — Encounter: Payer: Self-pay | Admitting: Internal Medicine

## 2023-05-29 LAB — CYTOLOGY - PAP
Comment: NEGATIVE
Diagnosis: NEGATIVE
Diagnosis: REACTIVE
High risk HPV: NEGATIVE

## 2023-08-11 ENCOUNTER — Telehealth: Payer: Self-pay | Admitting: *Deleted

## 2023-08-11 NOTE — Telephone Encounter (Signed)
Spoke with patient, due for BMD, requesting order to [place].   Last AEX 05/23/23 Last BMD 05/29/21, Osteopenia.   Order to [facility] to sign for faxing.  Patient will call to schedule.   Routing FYI.   Encounter closed.

## 2023-09-30 ENCOUNTER — Encounter: Payer: Self-pay | Admitting: Radiology

## 2023-10-01 ENCOUNTER — Encounter: Payer: Self-pay | Admitting: Nurse Practitioner

## 2023-11-13 ENCOUNTER — Ambulatory Visit: Payer: Medicare Other

## 2023-12-10 ENCOUNTER — Ambulatory Visit (INDEPENDENT_AMBULATORY_CARE_PROVIDER_SITE_OTHER)

## 2023-12-10 VITALS — BP 142/62 | HR 84 | Ht 64.0 in | Wt 138.4 lb

## 2023-12-10 DIAGNOSIS — Z0000 Encounter for general adult medical examination without abnormal findings: Secondary | ICD-10-CM | POA: Diagnosis not present

## 2023-12-10 NOTE — Patient Instructions (Addendum)
 Joanne Prince , Thank you for taking time to come for your Medicare Wellness Visit. I appreciate your ongoing commitment to your health goals. Please review the following plan we discussed and let me know if I can assist you in the future.   Referrals/Orders/Follow-Ups/Clinician Recommendations: Aim for 30 minutes of exercise or brisk walking, 6-8 glasses of water, and 5 servings of fruits and vegetables each day.   This is a list of the screening recommended for you and due dates:  Health Maintenance  Topic Date Due   Pneumonia Vaccine (1 of 1 - PCV) Never done   Flu Shot  04/02/2024   Colon Cancer Screening  12/05/2024   Medicare Annual Wellness Visit  12/09/2024   DTaP/Tdap/Td vaccine (4 - Td or Tdap) 03/07/2025   Mammogram  05/25/2025   DEXA scan (bone density measurement)  Completed   Hepatitis C Screening  Completed   Zoster (Shingles) Vaccine  Completed   HPV Vaccine  Aged Out   COVID-19 Vaccine  Discontinued    Advanced directives: (Copy Requested) Please bring a copy of your health care power of attorney and living will to the office to be added to your chart at your convenience. You can mail to [facility] [address] [place], [place] [address] or email to [email or web address]  Next Medicare Annual Wellness Visit scheduled for next year: Yes

## 2023-12-10 NOTE — Progress Notes (Signed)
 Subjective:   Joanne Prince is a 68 y.o. who presents for a Medicare Wellness preventive visit.  Visit Complete: In person  Persons Participating in Visit: Patient.  AWV Questionnaire: No: Patient Medicare AWV questionnaire was not completed prior to this visit.  Cardiac Risk Factors include: advanced age (>15men, >55 women);dyslipidemia     Objective:    Today's Vitals   12/10/23 1454 12/10/23 1515  BP: (!) 148/62 (!) 142/62  Pulse: 84   SpO2: 98%   Weight: 138 lb 6.4 oz (62.8 kg)   Height: 5\' 4"  (1.626 m)    Body mass index is 23.76 kg/m.     12/10/2023    2:52 PM 01/27/2020    8:16 AM  Advanced Directives  Does Patient Have a Medical Advance Directive? Yes No  Type of Estate agent of [place];Living will   Copy of Healthcare Power of Attorney in Chart? No - copy requested   Would patient like information on creating a medical advance directive?  No - Patient declined    Current Medications (verified) Outpatient Encounter Medications as of 12/10/2023  Medication Sig   Ascorbic Acid (VITAMIN C PO) Take by mouth.   Cholecalciferol (VITAMIN D3 PO) Take by mouth.   cyanocobalamin (VITAMIN B12) 1000 MCG tablet Take 1,000 mcg by mouth daily. 1 tablet every other day   estradiol (VIVELLE-DOT) 0.0375 MG/24HR Place 1 patch onto the skin 2 (two) times a week.   fexofenadine (ALLEGRA) 180 MG tablet Take 180 mg by mouth as needed.   MAGNESIUM PO Take by mouth.   Multiple Vitamin (MULTIVITAMIN) tablet Take 1 tablet by mouth daily.   niacinamide 500 MG tablet Take 500 mg by mouth 2 (two) times daily with a meal.   progesterone (PROMETRIUM) 100 MG capsule Take 1 capsule (100 mg total) by mouth at bedtime.   No facility-administered encounter medications on file as of 12/10/2023.    Allergies (verified) Patient has no known allergies.   History: Past Medical History:  Diagnosis Date   ALLERGIC RHINITIS 07/01/2007   Qualifier: Diagnosis of  By: Jonny Ruiz MD,  Len Blalock    ASCUS of cervix with negative high risk HPV 03/2017, 04/2019   DISC DISEASE, LUMBAR 07/01/2007   Qualifier: Diagnosis of  By: Jonny Ruiz MD, Len Blalock    GENITAL HERPES, HX OF 07/01/2007   Qualifier: Diagnosis of  By: Jonny Ruiz MD, Len Blalock    Heart murmur    History of indigestion    History of kidney stones    HYPERLIPIDEMIA 07/26/2010   Qualifier: Diagnosis of  By: Jonny Ruiz MD, Len Blalock    Kidney stone 01/13/2020   Kidney stones    MITRAL VALVE PROLAPSE, HX OF 07/01/2007   Qualifier: Diagnosis of  By: Jonny Ruiz MD, Len Blalock    NEPHROLITHIASIS, HX OF 10/28/2007   Qualifier: Diagnosis of  By: Debby Bud MD, Liborio Nixon, LOCAL NOS, SHOULDER 07/01/2007   Qualifier: Diagnosis of  By: Jonny Ruiz MD, Len Blalock    OSTEOPENIA 05/2019   T score -2.0 FRAX 9.8% / 1.3% stable from prior DEXA   Past Surgical History:  Procedure Laterality Date   cholecystectomy     CYSTOSCOPY WITH RETROGRADE PYELOGRAM, URETEROSCOPY AND STENT PLACEMENT Left 11/06/2018   Procedure: CYSTOSCOPY WITH RETROGRADE PYELOGRAM, URETEROSCOPY AND STENT PLACEMENT;  Surgeon: Sebastian Ache, MD;  Location: [facility];  Service: Urology;  Laterality: Left;   HOLMIUM LASER APPLICATION Left 11/06/2018   Procedure: HOLMIUM LASER APPLICATION;  Surgeon: Sebastian Ache, MD;  Location: [facility];  Service: Urology;  Laterality: Left;   LASIX Bilateral    uterine polypectomy     WISDOM TOOTH EXTRACTION     Family History  Problem Relation Age of Onset   Hypertension Mother    Celiac disease Maternal Uncle    Social History   Socioeconomic History   Marital status: Married    Spouse name: Not on file   Number of children: Not on file   Years of education: Not on file   Highest education level: Not on file  Occupational History   Not on file  Tobacco Use   Smoking status: Never    Passive exposure: Never   Smokeless tobacco: Never  Vaping Use   Vaping status: Never Used  Substance and Sexual  Activity   Alcohol use: Yes    Alcohol/week: 1.0 standard drink of alcohol    Types: 1 Glasses of wine per week    Comment: occ   Drug use: No   Sexual activity: Not Currently    Partners: Male    Birth control/protection: Post-menopausal    Comment: 1st intercourse 68 yo-Fewer than 5 partners,des neg  Other Topics Concern   Not on file  Social History Narrative   Married   Social Drivers of Health   Financial Resource Strain: Low Risk  (12/10/2023)   Overall Financial Resource Strain (CARDIA)    Difficulty of Paying Living Expenses: Not hard at all  Food Insecurity: No Food Insecurity (12/10/2023)   Hunger Vital Sign    Worried About Running Out of Food in the Last Year: Never true    Ran Out of Food in the Last Year: Never true  Transportation Needs: No Transportation Needs (12/10/2023)   PRAPARE - Administrator, Civil Service (Medical): No    Lack of Transportation (Non-Medical): No  Physical Activity: Insufficiently Active (12/10/2023)   Exercise Vital Sign    Days of Exercise per Week: 1 day    Minutes of Exercise per Session: 30 min  Stress: No Stress Concern Present (12/10/2023)   [employer] of Occupational Health - Occupational Stress Questionnaire    Feeling of Stress : Not at all  Social Connections: Moderately Isolated (12/10/2023)   Social Connection and Isolation Panel [NHANES]    Frequency of Communication with Friends and Family: More than three times a week    Frequency of Social Gatherings with Friends and Family: More than three times a week    Attends Religious Services: Never    Database administrator or Organizations: No    Attends Engineer, structural: Never    Marital Status: Married    Tobacco Counseling Counseling given: No    Clinical Intake:  Pre-visit preparation completed: Yes  Pain : No/denies pain     BMI - recorded: 23.76 Nutritional Status: BMI of 19-24  Normal Nutritional Risks: None Diabetes: No  Lab  Results  Component Value Date   HGBA1C 5.7 01/17/2023   HGBA1C 5.6 01/18/2022   HGBA1C 5.7 01/10/2021     How often do you need to have someone help you when you read instructions, pamphlets, or other written materials from your doctor or pharmacy?: 1 - Never  Interpreter Needed?: No  Information entered by :: Hassell Halim, CMA   Activities of Daily Living     12/10/2023    2:59 PM  In your present state of health, do you have any difficulty performing  the following activities:  Hearing? 1  Comment ringing in ears  Vision? 0  Difficulty concentrating or making decisions? 0  Walking or climbing stairs? 0  Dressing or bathing? 0  Doing errands, shopping? 0  Preparing Food and eating ? N  Using the Toilet? N  In the past six months, have you accidently leaked urine? N  Do you have problems with loss of bowel control? N  Managing your Medications? N  Managing your Finances? N  Housekeeping or managing your Housekeeping? N    Patient Care Team: Corwin Levins, MD as PCP - General Tanda Rockers, NP as Nurse Practitioner (Obstetrics and Gynecology) Mateo Flow, MD as Consulting Physician (Ophthalmology) Luciana Axe Alford Highland, MD as Consulting Physician (Ophthalmology)  Indicate any recent Medical Services you may have received from other than [facility] providers in the past year (date may be approximate).     Assessment:   This is a routine wellness examination for Tabithia.  Hearing/Vision screen Hearing Screening - Comments:: Exp. Ringing in ears - pt prefers to f/u w/PCP in 01/2024 Vision Screening - Comments:: Sees Dr Elmer Picker annually   Goals Addressed               This Visit's Progress     Patient Stated (pt-stated)        Patient stated she plans to continue being active (including working and with grandchildren).       Depression Screen     12/10/2023    3:07 PM 05/22/2023    8:41 AM 01/24/2023    2:05 PM 01/21/2022    2:14 PM 01/21/2022    1:57 PM 01/18/2021     2:34 PM 01/18/2021    2:06 PM  PHQ 2/9 Scores  PHQ - 2 Score 0 0 0 0 0 0 0  PHQ- 9 Score 0    0      Fall Risk     12/10/2023    3:02 PM 05/22/2023    8:40 AM 01/24/2023    2:05 PM 11/20/2022    2:11 PM 01/21/2022    2:14 PM  Fall Risk   Falls in the past year? 1 0 0 0 0  Number falls in past yr: 0 0 0 0 0  Comment 1      Injury with Fall? 1 0 0 0 0  Comment was seen by Provider - no fracture      Risk for fall due to :  No Fall Risks No Fall Risks No Fall Risks   Follow up Falls evaluation completed;Falls prevention discussed Falls evaluation completed Falls evaluation completed Falls evaluation completed     MEDICARE RISK AT HOME:  Medicare Risk at Home Any stairs in or around the home?: No If so, are there any without handrails?: No Home free of loose throw rugs in walkways, pet beds, electrical cords, etc?: Yes Adequate lighting in your home to reduce risk of falls?: Yes Life alert?: No Use of a cane, walker or w/c?: No Grab bars in the bathroom?: No Shower chair or bench in shower?: Yes Elevated toilet seat or a handicapped toilet?: No  TIMED UP AND GO:  Was the test performed?  No  Cognitive Function: 6CIT completed        12/10/2023    3:03 PM  6CIT Screen  What Year? 0 points  What month? 0 points  What time? 0 points  Count back from 20 0 points  Months in reverse 0 points  Repeat phrase 0 points  Total Score 0 points    Immunizations Immunization History  Administered Date(s) Administered   Fluad Trivalent(High Dose 65+) 05/22/2023   Influenza Inj Mdck Quad Pf 05/26/2019   Influenza Split 06/09/2012, 04/14/2016   Influenza Whole 06/21/2008, 07/25/2009   Influenza,inj,Quad PF,6+ Mos 07/10/2013, 06/16/2014, 04/29/2017, 04/27/2018   Influenza-Unspecified 06/27/2015, 04/29/2017   PFIZER(Purple Top)SARS-COV-2 Vaccination 10/29/2019, 11/19/2019, 07/28/2020   Td 09/02/2001, 07/03/2010   Tdap 03/08/2015   Zoster Recombinant(Shingrix) 04/29/2017,  12/18/2017, 12/19/2017    Screening Tests Health Maintenance  Topic Date Due   Pneumonia Vaccine 91+ Years old (1 of 1 - PCV) Never done   INFLUENZA VACCINE  04/02/2024   Colonoscopy  12/05/2024   Medicare Annual Wellness (AWV)  12/09/2024   DTaP/Tdap/Td (4 - Td or Tdap) 03/07/2025   MAMMOGRAM  05/25/2025   DEXA SCAN  Completed   Hepatitis C Screening  Completed   Zoster Vaccines- Shingrix  Completed   HPV VACCINES  Aged Out   COVID-19 Vaccine  Discontinued    Health Maintenance  Health Maintenance Due  Topic Date Due   Pneumonia Vaccine 36+ Years old (1 of 1 - PCV) Never done   Health Maintenance Items Addressed: 12/10/2023 Pt is c/o ringing in ears - has an appt w/PCP in 01/2024.  At this time declines an Audiologist referral until seen by PCP.  Additional Screening:  Vision Screening: Recommended annual ophthalmology exams for early detection of glaucoma and other disorders of the eye.  Dental Screening: Recommended annual dental exams for proper oral hygiene  Community Resource Referral / Chronic Care Management: CRR required this visit?    CCM required this visit?  No     Plan:     I have personally reviewed and noted the following in the patient's chart:   Medical and social history Use of alcohol, tobacco or illicit drugs  Current medications and supplements including opioid prescriptions. Patient is not currently taking opioid prescriptions. Functional ability and status Nutritional status Physical activity Advanced directives List of other physicians Hospitalizations, surgeries, and ER visits in previous 12 months Vitals Screenings to include cognitive, depression, and falls Referrals and appointments  In addition, I have reviewed and discussed with patient certain preventive protocols, quality metrics, and best practice recommendations. A written personalized care plan for preventive services as well as general preventive health recommendations were  provided to patient.     Darreld Mclean, CMA   12/10/2023   After Visit Summary: (In Person-Declined) Patient declined AVS at this time.  Notes: Please refer to Routing Comments.

## 2024-01-27 ENCOUNTER — Ambulatory Visit: Payer: Self-pay | Admitting: Internal Medicine

## 2024-01-27 ENCOUNTER — Encounter: Payer: Self-pay | Admitting: Internal Medicine

## 2024-01-27 ENCOUNTER — Ambulatory Visit (INDEPENDENT_AMBULATORY_CARE_PROVIDER_SITE_OTHER): Payer: Medicare Other | Admitting: Internal Medicine

## 2024-01-27 VITALS — BP 168/82 | HR 70 | Temp 98.1°F | Ht 64.0 in | Wt 137.0 lb

## 2024-01-27 DIAGNOSIS — R739 Hyperglycemia, unspecified: Secondary | ICD-10-CM

## 2024-01-27 DIAGNOSIS — E538 Deficiency of other specified B group vitamins: Secondary | ICD-10-CM

## 2024-01-27 DIAGNOSIS — E7800 Pure hypercholesterolemia, unspecified: Secondary | ICD-10-CM | POA: Diagnosis not present

## 2024-01-27 DIAGNOSIS — Z23 Encounter for immunization: Secondary | ICD-10-CM | POA: Diagnosis not present

## 2024-01-27 DIAGNOSIS — R030 Elevated blood-pressure reading, without diagnosis of hypertension: Secondary | ICD-10-CM

## 2024-01-27 DIAGNOSIS — J309 Allergic rhinitis, unspecified: Secondary | ICD-10-CM | POA: Diagnosis not present

## 2024-01-27 DIAGNOSIS — E559 Vitamin D deficiency, unspecified: Secondary | ICD-10-CM | POA: Diagnosis not present

## 2024-01-27 LAB — HEPATIC FUNCTION PANEL
ALT: 15 U/L (ref 0–35)
AST: 20 U/L (ref 0–37)
Albumin: 4.6 g/dL (ref 3.5–5.2)
Alkaline Phosphatase: 48 U/L (ref 39–117)
Bilirubin, Direct: 0.2 mg/dL (ref 0.0–0.3)
Total Bilirubin: 1.3 mg/dL — ABNORMAL HIGH (ref 0.2–1.2)
Total Protein: 7.5 g/dL (ref 6.0–8.3)

## 2024-01-27 LAB — CBC WITH DIFFERENTIAL/PLATELET
Basophils Absolute: 0 10*3/uL (ref 0.0–0.1)
Basophils Relative: 0.8 % (ref 0.0–3.0)
Eosinophils Absolute: 0.1 10*3/uL (ref 0.0–0.7)
Eosinophils Relative: 1.6 % (ref 0.0–5.0)
HCT: 44.4 % (ref 36.0–46.0)
Hemoglobin: 14.7 g/dL (ref 12.0–15.0)
Lymphocytes Relative: 31.2 % (ref 12.0–46.0)
Lymphs Abs: 1.8 10*3/uL (ref 0.7–4.0)
MCHC: 33.1 g/dL (ref 30.0–36.0)
MCV: 90.2 fl (ref 78.0–100.0)
Monocytes Absolute: 0.3 10*3/uL (ref 0.1–1.0)
Monocytes Relative: 5.5 % (ref 3.0–12.0)
Neutro Abs: 3.5 10*3/uL (ref 1.4–7.7)
Neutrophils Relative %: 60.9 % (ref 43.0–77.0)
Platelets: 243 10*3/uL (ref 150.0–400.0)
RBC: 4.92 Mil/uL (ref 3.87–5.11)
RDW: 13.2 % (ref 11.5–15.5)
WBC: 5.8 10*3/uL (ref 4.0–10.5)

## 2024-01-27 LAB — LIPID PANEL
Cholesterol: 189 mg/dL (ref 0–200)
HDL: 66 mg/dL (ref >39.00)
LDL Cholesterol: 99 mg/dL (ref 0–99)
NonHDL: 122.6
Total CHOL/HDL Ratio: 3
Triglycerides: 116 mg/dL (ref 0.0–149.0)
VLDL: 23.2 mg/dL (ref 0.0–40.0)

## 2024-01-27 LAB — URINALYSIS, ROUTINE W REFLEX MICROSCOPIC
Bilirubin Urine: NEGATIVE
Ketones, ur: NEGATIVE
Leukocytes,Ua: NEGATIVE
Nitrite: NEGATIVE
Specific Gravity, Urine: <=1.005 — AB (ref 1.000–1.030)
Total Protein, Urine: NEGATIVE
Urine Glucose: NEGATIVE
Urobilinogen, UA: 0.2 (ref 0.0–1.0)
pH: 6 (ref 5.0–8.0)

## 2024-01-27 LAB — BASIC METABOLIC PANEL WITH GFR
BUN: 14 mg/dL (ref 6–23)
CO2: 26 meq/L (ref 19–32)
Calcium: 9.1 mg/dL (ref 8.4–10.5)
Chloride: 105 meq/L (ref 96–112)
Creatinine, Ser: 0.66 mg/dL (ref 0.40–1.20)
GFR: 91.34 mL/min (ref >60.00)
Glucose, Bld: 94 mg/dL (ref 70–99)
Potassium: 4.4 meq/L (ref 3.5–5.1)
Sodium: 140 meq/L (ref 135–145)

## 2024-01-27 LAB — VITAMIN D 25 HYDROXY (VIT D DEFICIENCY, FRACTURES): VITD: 62.94 ng/mL (ref 30.00–100.00)

## 2024-01-27 LAB — VITAMIN B12: Vitamin B-12: 628 pg/mL (ref 211–911)

## 2024-01-27 LAB — HEMOGLOBIN A1C: Hgb A1c MFr Bld: 5.7 % (ref 4.6–6.5)

## 2024-01-27 LAB — TSH: TSH: 1.62 u[IU]/mL (ref 0.35–5.50)

## 2024-01-27 NOTE — Patient Instructions (Signed)
 You had the Prevnar 20 pneumonia shot today  Please continue all other medications as before, and refills have been done if requested.  Please have the pharmacy call with any other refills you may need.  Please continue your efforts at being more active, low cholesterol diet, and weight control.  You are otherwise up to date with prevention measures today.  Please keep your appointments with your specialists as you may have planned  Please go to the LAB at the blood drawing area for the tests to be done  You will be contacted by phone if any changes need to be made immediately.  Otherwise, you will receive a letter about your results with an explanation, but please check with MyChart first.  Please make an Appointment to return for your 1 year visit, or sooner if needed

## 2024-01-27 NOTE — Assessment & Plan Note (Signed)
 Mod elevated more than last visit, pt declines any change in tx due to white coat htn

## 2024-01-27 NOTE — Assessment & Plan Note (Signed)
 Lab Results  Component Value Date   HGBA1C 5.7 01/27/2024   Stable, pt to continue current medical treatment  - diet, wt control

## 2024-01-27 NOTE — Assessment & Plan Note (Signed)
 Mild to mod, for allegra  180 mg prn,  to f/u any worsening symptoms or concerns

## 2024-01-27 NOTE — Assessment & Plan Note (Signed)
 Lab Results  Component Value Date   LDLCALC 99 01/27/2024   Stable, pt to cont low chol diet

## 2024-01-27 NOTE — Progress Notes (Signed)
 The test results show that your current treatment is OK, as the tests are stable.  Please continue the same plan.  There is no other need for change of treatment or further evaluation based on these results, at this time.  thanks

## 2024-01-27 NOTE — Progress Notes (Signed)
 Patient ID: Joanne Prince, female   DOB: [birth year], 68 y.o.   MRN: [id number]         Chief Complaint:: yearly exam       HPI:  Joanne Prince is a 68 y.o. female here overall doin ok, Pt denies chest pain, increased sob or doe, wheezing, orthopnea, PND, increased LE swelling, palpitations, dizziness or syncope.   Pt denies polydipsia, polyuria, or new focal neuro s/s.    Pt denies fever, wt loss, night sweats, loss of appetite, or other constitutional symptoms  Due for prevnar 20  BP has been controlled at home. Does have several wks ongoing nasal allergy symptoms with clearish congestion, itch and sneezing, without fever, pain, ST, cough, swelling or wheezing. Wt Readings from Last 3 Encounters:  01/27/24 137 lb (62.1 kg)  12/10/23 138 lb 6.4 oz (62.8 kg)  05/23/23 132 lb (59.9 kg)   BP Readings from Last 3 Encounters:  01/27/24 (!) 168/82  12/10/23 (!) 142/62  05/23/23 138/78   Immunization History  Administered Date(s) Administered   Fluad Trivalent(High Dose 65+) 05/22/2023   Influenza Inj Mdck Quad Pf 05/26/2019   Influenza Split 06/09/2012, 04/14/2016   Influenza Whole 06/21/2008, 07/25/2009   Influenza,inj,Quad PF,6+ Mos 07/10/2013, 06/16/2014, 04/29/2017, 04/27/2018   Influenza-Unspecified 06/27/2015, 04/29/2017   PFIZER(Purple Top)SARS-COV-2 Vaccination 10/29/2019, 11/19/2019, 07/28/2020   PNEUMOCOCCAL CONJUGATE-20 01/27/2024   Td 09/02/2001, 07/03/2010   Tdap 03/08/2015   Zoster Recombinant(Shingrix) 04/29/2017, 12/18/2017, 12/19/2017   There are no preventive care reminders to display for this patient.     Past Medical History:  Diagnosis Date   ALLERGIC RHINITIS 07/01/2007   Qualifier: Diagnosis of  By: Autry Legions MD, Alveda Aures    ASCUS of cervix with negative high risk HPV 03/2017, 04/2019   DISC DISEASE, LUMBAR 07/01/2007   Qualifier: Diagnosis of  By: Autry Legions MD, Alveda Aures    GENITAL HERPES, HX OF 07/01/2007   Qualifier: Diagnosis of  By: Autry Legions MD, Alveda Aures    Heart murmur     History of indigestion    History of kidney stones    HYPERLIPIDEMIA 07/26/2010   Qualifier: Diagnosis of  By: Autry Legions MD, Alveda Aures    Kidney stone 01/13/2020   Kidney stones    MITRAL VALVE PROLAPSE, HX OF 07/01/2007   Qualifier: Diagnosis of  By: Autry Legions MD, Alveda Aures    NEPHROLITHIASIS, HX OF 10/28/2007   Qualifier: Diagnosis of  By: Edmonia Gottron MD, Dimple Francis, LOCAL NOS, SHOULDER 07/01/2007   Qualifier: Diagnosis of  By: Autry Legions MD, Alveda Aures    OSTEOPENIA 05/2019   T score -2.0 FRAX 9.8% / 1.3% stable from prior DEXA   Past Surgical History:  Procedure Laterality Date   cholecystectomy     CYSTOSCOPY WITH RETROGRADE PYELOGRAM, URETEROSCOPY AND STENT PLACEMENT Left 11/06/2018   Procedure: CYSTOSCOPY WITH RETROGRADE PYELOGRAM, URETEROSCOPY AND STENT PLACEMENT;  Surgeon: Osborn Blaze, MD;  Location: [facility];  Service: Urology;  Laterality: Left;   HOLMIUM LASER APPLICATION Left 11/06/2018   Procedure: HOLMIUM LASER APPLICATION;  Surgeon: Osborn Blaze, MD;  Location: [facility];  Service: Urology;  Laterality: Left;   LASIX Bilateral    uterine polypectomy     WISDOM TOOTH EXTRACTION      reports that she has never smoked. She has never been exposed to tobacco smoke. She has never used smokeless tobacco. She reports current alcohol use of about 1.0 standard drink of alcohol per week. She reports  that she does not use drugs. family history includes Celiac disease in her maternal uncle; Hypertension in her mother. No Known Allergies Current Outpatient Medications on File Prior to Visit  Medication Sig Dispense Refill   Ascorbic Acid (VITAMIN C PO) Take by mouth.     Cholecalciferol (VITAMIN D3 PO) Take by mouth.     cyanocobalamin  (VITAMIN B12) 1000 MCG tablet Take 1,000 mcg by mouth daily. 1 tablet every other day     estradiol  (VIVELLE -DOT) 0.0375 MG/24HR Place 1 patch onto the skin 2 (two) times a week. 24 patch 4   fexofenadine  (ALLEGRA ) 180  MG tablet Take 180 mg by mouth as needed.     MAGNESIUM PO Take by mouth.     Multiple Vitamin (MULTIVITAMIN) tablet Take 1 tablet by mouth daily.     niacinamide 500 MG tablet Take 500 mg by mouth 2 (two) times daily with a meal.     progesterone  (PROMETRIUM ) 100 MG capsule Take 1 capsule (100 mg total) by mouth at bedtime. 90 capsule 4   No current facility-administered medications on file prior to visit.        ROS:  All others reviewed and negative.  Objective        PE:  BP (!) 168/82 (BP Location: Right Arm, Patient Position: Sitting, Cuff Size: Normal)   Pulse 70   Temp 98.1 F (36.7 C) (Oral)   Ht 5\' 4"  (1.626 m)   Wt 137 lb (62.1 kg)   LMP 03/08/2005 Comment: not sexually active  SpO2 100%   BMI 23.52 kg/m                 Constitutional: Pt appears in NAD               HENT: Head: NCAT.                Right Ear: External ear normal.                 Left Ear: External ear normal.                Eyes: . Pupils are equal, round, and reactive to light. Conjunctivae and EOM are normal               Nose: without d/c or deformity               Neck: Neck supple. Gross normal ROM               Cardiovascular: Normal rate and regular rhythm.                 Pulmonary/Chest: Effort normal and breath sounds without rales or wheezing.                Abd:  Soft, NT, ND, + BS, no organomegaly               Neurological: Pt is alert. At baseline orientation, motor grossly intact               Skin: Skin is warm. No rashes, no other new lesions, LE edema - none               Psychiatric: Pt behavior is normal without agitation   Micro: none  Cardiac tracings I have personally interpreted today:  none  Pertinent Radiological findings (summarize): none   Lab Results  Component Value Date   WBC 5.8 01/27/2024   HGB 14.7 01/27/2024  HCT 44.4 01/27/2024   PLT 243.0 01/27/2024   GLUCOSE 94 01/27/2024   CHOL 189 01/27/2024   TRIG 116.0 01/27/2024   HDL 66.00 01/27/2024    LDLDIRECT 113.0 01/13/2020   LDLCALC 99 01/27/2024   ALT 15 01/27/2024   AST 20 01/27/2024   NA 140 01/27/2024   K 4.4 01/27/2024   CL 105 01/27/2024   CREATININE 0.66 01/27/2024   BUN 14 01/27/2024   CO2 26 01/27/2024   TSH 1.62 01/27/2024   HGBA1C 5.7 01/27/2024   Assessment/Plan:  Joanne Prince is a 68 y.o. 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  HLD (hyperlipidemia) Lab Results  Component Value Date   LDLCALC 99 01/27/2024   Stable, pt to cont low chol diet   Blood pressure elevated without history of HTN Mod elevated more than last visit, pt declines any change in tx due to white coat htn  Hyperglycemia Lab Results  Component Value Date   HGBA1C 5.7 01/27/2024   Stable, pt to continue current medical treatment  - diet, wt control   Allergic rhinitis Mild to mod, for allegra  180 mg prn,  to f/u any worsening symptoms or concerns  Followup: Return in about 1 year (around 01/26/2025).  Rosalia Colonel, MD 01/27/2024 8:17 PM [facility]

## 2024-05-25 ENCOUNTER — Encounter: Payer: Self-pay | Admitting: Radiology

## 2024-05-25 ENCOUNTER — Ambulatory Visit (INDEPENDENT_AMBULATORY_CARE_PROVIDER_SITE_OTHER): Payer: Medicare Other | Admitting: Radiology

## 2024-05-25 ENCOUNTER — Other Ambulatory Visit (HOSPITAL_COMMUNITY)
Admission: RE | Admit: 2024-05-25 | Discharge: 2024-05-25 | Disposition: A | Source: Ambulatory Visit | Attending: Radiology | Admitting: Radiology

## 2024-05-25 VITALS — BP 168/80 | HR 81 | Ht 65.0 in | Wt 135.0 lb

## 2024-05-25 DIAGNOSIS — M8580 Other specified disorders of bone density and structure, unspecified site: Secondary | ICD-10-CM | POA: Diagnosis not present

## 2024-05-25 DIAGNOSIS — Z01419 Encounter for gynecological examination (general) (routine) without abnormal findings: Secondary | ICD-10-CM

## 2024-05-25 DIAGNOSIS — Z780 Asymptomatic menopausal state: Secondary | ICD-10-CM

## 2024-05-25 DIAGNOSIS — N870 Mild cervical dysplasia: Secondary | ICD-10-CM | POA: Diagnosis present

## 2024-05-25 DIAGNOSIS — Z9189 Other specified personal risk factors, not elsewhere classified: Secondary | ICD-10-CM | POA: Diagnosis not present

## 2024-05-25 DIAGNOSIS — B009 Herpesviral infection, unspecified: Secondary | ICD-10-CM | POA: Diagnosis not present

## 2024-05-25 DIAGNOSIS — Z79890 Hormone replacement therapy (postmenopausal): Secondary | ICD-10-CM

## 2024-05-25 MED ORDER — ESTRADIOL 0.0375 MG/24HR TD PTTW: 1.0000 | MEDICATED_PATCH | TRANSDERMAL | 4 refills | Status: AC

## 2024-05-25 MED ORDER — PROGESTERONE MICRONIZED 100 MG PO CAPS: 100.0000 mg | ORAL_CAPSULE | Freq: Every evening | ORAL | 4 refills | Status: DC

## 2024-05-25 NOTE — Progress Notes (Signed)
 Joanne Prince [birth year] [id number]   History: Postmenopausal 68 y.o. presents for annual exam. 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 Doing well on HRT, needs refills.   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 Results were: normal HPV negative Last mammogram: 05/26/23. Results were: normal Last colonoscopy: 2016 DEXA:2025 osteopenia   Obstetric History OB History  Gravida Para Term Preterm AB Living  2 2 2   2   SAB IAB Ectopic Multiple Live Births          # Outcome Date GA Lbr Len/2nd Weight Sex Type Anes PTL Lv  2 Term           1 Term              The following portions of the patient's history were reviewed and updated as appropriate: allergies, current medications, past family history, past medical history, past social history, past surgical history, and problem list.  Review of Systems Pertinent items noted in HPI and remainder of comprehensive ROS otherwise negative.  Past medical history, past surgical history, family history and social history were all reviewed and documented in the EPIC chart.  Exam:  There were no vitals filed for this visit.  There is no height or weight on file to calculate BMI.  General appearance:  Normal Thyroid :  Symmetrical, normal in size, without palpable masses or nodularity. Respiratory  Auscultation:  Clear without wheezing or rhonchi Cardiovascular  Auscultation:  Regular rate, without rubs, murmurs or gallops  Edema/varicosities:  Not grossly evident Abdominal  Soft,nontender, without masses, guarding or  rebound.  Liver/spleen:  No organomegaly noted  Hernia:  None appreciated  Skin  Inspection:  Grossly normal Breasts: Examined lying and sitting.   Right: Without masses, retractions, nipple discharge or axillary adenopathy.   Left: Without masses, retractions, nipple discharge or axillary adenopathy. Genitourinary   Inguinal/mons:  Normal without inguinal adenopathy  External genitalia:  Normal appearing vulva with no masses, tenderness, or lesions  BUS/Urethra/Skene's glands:  Normal  Vagina:  Normal appearing with normal color and discharge, no lesions. Atrophy: mild   Cervix:  Normal appearing without discharge or lesions  Uterus:  Normal in size, shape and contour.  Midline and mobile, nontender  Adnexa/parametria:     Rt: Normal in size, without masses or tenderness.   Lt: Normal in size, without masses or tenderness.  Anus and perineum: Normal    Darice Hoit, CMA present for exam  Assessment/Plan:   1. Encounter for breast and pelvic examination (Primary) - Cytology - PAP( [place])  2. Dysplasia of cervix, low grade (CIN 1) - Cytology - PAP( [place])  3. Postmenopausal hormone replacement therapy - estradiol  (VIVELLE -DOT) 0.0375 MG/24HR; Place 1 patch onto the skin 2 (two) times a week.  Dispense: 24 patch; Refill: 4 - progesterone  (PROMETRIUM ) 100 MG capsule; Take 1 capsule (100 mg total) by mouth at bedtime.  Dispense: 90 capsule; Refill: 4  4. Osteopenia after menopause  Continue exercise and vitamin D   Return in 1 year for HR B&P  Denica Web B WHNP-BC, 2:27 PM 05/25/2024

## 2024-05-28 ENCOUNTER — Ambulatory Visit: Payer: Self-pay | Admitting: Radiology

## 2024-05-28 LAB — CYTOLOGY - PAP
Adequacy: ABSENT
Comment: NEGATIVE
Diagnosis: NEGATIVE
High risk HPV: NEGATIVE

## 2024-05-31 LAB — HM MAMMOGRAPHY

## 2024-07-21 ENCOUNTER — Other Ambulatory Visit: Payer: Self-pay | Admitting: Radiology

## 2024-07-21 DIAGNOSIS — Z79890 Hormone replacement therapy (postmenopausal): Secondary | ICD-10-CM

## 2024-07-21 NOTE — Telephone Encounter (Addendum)
 Med refill request:    Progesterone  (PROMETRIUM ) 100 MG capsule  Start:  05/25/24 Disp:  90 capsules  Refills:  4  Last B&P:  05/25/24 Next B&P:  05/26/25 Last MMG (if hormonal med):  05/31/24 Refill authorized? Please Advise.

## 2024-10-05 LAB — OPHTHALMOLOGY REPORT-SCANNED

## 2024-12-13 ENCOUNTER — Ambulatory Visit

## 2025-01-31 ENCOUNTER — Encounter: Admitting: Internal Medicine

## 2025-03-28 ENCOUNTER — Encounter: Admitting: Family Medicine

## 2025-05-26 ENCOUNTER — Encounter: Admitting: Radiology
